# Patient Record
Sex: Female | Born: 1980 | Hispanic: No | Marital: Married | Smoking: Never smoker
Health system: Southern US, Community
[De-identification: ages and names within clinical notes are randomized; demographics above are authoritative.]

## PROBLEM LIST (undated history)

## (undated) DIAGNOSIS — D649 Anemia, unspecified: Secondary | ICD-10-CM

## (undated) DIAGNOSIS — I10 Essential (primary) hypertension: Secondary | ICD-10-CM

## (undated) HISTORY — PX: NO PAST SURGERIES: SHX2092

---

## 2012-12-07 ENCOUNTER — Ambulatory Visit (HOSPITAL_COMMUNITY)
Admission: RE | Admit: 2012-12-07 | Discharge: 2012-12-07 | Disposition: A | Discharge status: Home / Self Care | Payer: No Typology Code available for payment source | Source: Ambulatory Visit | Attending: Internal Medicine | Admitting: Internal Medicine

## 2012-12-07 ENCOUNTER — Other Ambulatory Visit (HOSPITAL_COMMUNITY): Payer: Self-pay | Admitting: Internal Medicine

## 2012-12-07 DIAGNOSIS — M549 Dorsalgia, unspecified: Secondary | ICD-10-CM

## 2013-03-14 ENCOUNTER — Ambulatory Visit: Payer: Self-pay | Admitting: Family

## 2013-03-14 DIAGNOSIS — Z0289 Encounter for other administrative examinations: Secondary | ICD-10-CM

## 2013-05-05 ENCOUNTER — Ambulatory Visit: Payer: BC Managed Care – PPO

## 2013-05-18 ENCOUNTER — Ambulatory Visit: Payer: No Typology Code available for payment source

## 2013-09-22 ENCOUNTER — Encounter (HOSPITAL_COMMUNITY)
Admit: 2013-09-22 | Discharge: 2013-09-22 | Disposition: A | Discharge status: Home / Self Care | Payer: BC Managed Care – PPO | Attending: Obstetrics and Gynecology | Admitting: Obstetrics and Gynecology

## 2013-09-22 ENCOUNTER — Inpatient Hospital Stay (HOSPITAL_COMMUNITY): Payer: BC Managed Care – PPO

## 2013-09-22 ENCOUNTER — Encounter (HOSPITAL_COMMUNITY): Payer: Self-pay

## 2013-09-22 ENCOUNTER — Inpatient Hospital Stay (HOSPITAL_COMMUNITY)
Admission: AD | Admit: 2013-09-22 | Discharge: 2013-09-28 | DRG: 765 | Disposition: A | Discharge status: Home / Self Care | Payer: BC Managed Care – PPO | Source: Ambulatory Visit | Attending: Obstetrics and Gynecology | Admitting: Obstetrics and Gynecology

## 2013-09-22 DIAGNOSIS — D649 Anemia, unspecified: Secondary | ICD-10-CM | POA: Diagnosis present

## 2013-09-22 DIAGNOSIS — O1414 Severe pre-eclampsia complicating childbirth: Secondary | ICD-10-CM | POA: Diagnosis present

## 2013-09-22 DIAGNOSIS — O9903 Anemia complicating the puerperium: Secondary | ICD-10-CM | POA: Diagnosis present

## 2013-09-22 DIAGNOSIS — Z8261 Family history of arthritis: Secondary | ICD-10-CM | POA: Diagnosis not present

## 2013-09-22 DIAGNOSIS — O4400 Placenta previa specified as without hemorrhage, unspecified trimester: Secondary | ICD-10-CM | POA: Diagnosis present

## 2013-09-22 DIAGNOSIS — O9902 Anemia complicating childbirth: Secondary | ICD-10-CM | POA: Diagnosis present

## 2013-09-22 DIAGNOSIS — IMO0002 Reserved for concepts with insufficient information to code with codable children: Secondary | ICD-10-CM | POA: Diagnosis present

## 2013-09-22 DIAGNOSIS — Z8249 Family history of ischemic heart disease and other diseases of the circulatory system: Secondary | ICD-10-CM

## 2013-09-22 DIAGNOSIS — O1490 Unspecified pre-eclampsia, unspecified trimester: Secondary | ICD-10-CM | POA: Diagnosis present

## 2013-09-22 HISTORY — DX: Essential (primary) hypertension: I10

## 2013-09-22 HISTORY — DX: Anemia, unspecified: D64.9

## 2013-09-22 LAB — URINALYSIS, ROUTINE W REFLEX MICROSCOPIC
GLUCOSE, UA: NEGATIVE mg/dL
KETONES UR: NEGATIVE mg/dL
Leukocytes, UA: NEGATIVE
Nitrite: NEGATIVE
Specific Gravity, Urine: 1.02 (ref 1.005–1.030)
Urobilinogen, UA: 0.2 mg/dL (ref 0.0–1.0)
pH: 6.5 (ref 5.0–8.0)

## 2013-09-22 LAB — LACTATE DEHYDROGENASE: LDH: 410 U/L — ABNORMAL HIGH (ref 94–250)

## 2013-09-22 LAB — COMPREHENSIVE METABOLIC PANEL
ALT: 11 U/L (ref 0–35)
AST: 13 U/L (ref 0–37)
Albumin: 1.9 g/dL — ABNORMAL LOW (ref 3.5–5.2)
Alkaline Phosphatase: 101 U/L (ref 39–117)
Anion gap: 11 (ref 5–15)
BUN: 19 mg/dL (ref 6–23)
CALCIUM: 8.9 mg/dL (ref 8.4–10.5)
CO2: 21 meq/L (ref 19–32)
CREATININE: 0.93 mg/dL (ref 0.50–1.10)
Chloride: 105 mEq/L (ref 96–112)
GFR calc Af Amer: >90 mL/min (ref >90)
GFR, EST NON AFRICAN AMERICAN: 80 mL/min — AB (ref >90)
Glucose, Bld: 83 mg/dL (ref 70–99)
Potassium: 5.6 mEq/L — ABNORMAL HIGH (ref 3.7–5.3)
SODIUM: 137 meq/L (ref 137–147)
Total Bilirubin: <0.2 mg/dL — ABNORMAL LOW (ref 0.3–1.2)
Total Protein: 6 g/dL (ref 6.0–8.3)

## 2013-09-22 LAB — URINE MICROSCOPIC-ADD ON

## 2013-09-22 LAB — CBC
HCT: 39.6 % (ref 36.0–46.0)
Hemoglobin: 13.3 g/dL (ref 12.0–15.0)
MCH: 28.4 pg (ref 26.0–34.0)
MCHC: 33.6 g/dL (ref 30.0–36.0)
MCV: 84.4 fL (ref 78.0–100.0)
PLATELETS: 218 10*3/uL (ref 150–400)
RBC: 4.69 MIL/uL (ref 3.87–5.11)
RDW: 14.6 % (ref 11.5–15.5)
WBC: 11.6 10*3/uL — ABNORMAL HIGH (ref 4.0–10.5)

## 2013-09-22 LAB — PROTEIN / CREATININE RATIO, URINE: Creatinine, Urine: 363.67 mg/dL

## 2013-09-22 LAB — URIC ACID: URIC ACID, SERUM: 7.4 mg/dL — AB (ref 2.4–7.0)

## 2013-09-22 MED ORDER — HYDRALAZINE HCL 20 MG/ML IJ SOLN: 5.0000 mg | Freq: Once | INTRAMUSCULAR | Status: DC

## 2013-09-22 MED ORDER — LABETALOL HCL 100 MG PO TABS
200.0000 mg | ORAL_TABLET | Freq: Once | ORAL | Status: AC
  Administered 2013-09-22: 200 mg via ORAL
  Filled 2013-09-22: qty 2

## 2013-09-22 MED ORDER — ZOLPIDEM TARTRATE 5 MG PO TABS
5.0000 mg | ORAL_TABLET | Freq: Every evening | ORAL | Status: DC | PRN
  Administered 2013-09-22: 5 mg via ORAL
  Filled 2013-09-22: qty 1

## 2013-09-22 MED ORDER — BUTALBITAL-APAP-CAFFEINE 50-325-40 MG PO TABS
2.0000 | ORAL_TABLET | Freq: Four times a day (QID) | ORAL | Status: DC | PRN
Start: 2013-09-22 — End: 2013-09-24
  Administered 2013-09-22: 2 via ORAL
  Filled 2013-09-22: qty 2

## 2013-09-22 MED ORDER — DOCUSATE SODIUM 100 MG PO CAPS
100.0000 mg | ORAL_CAPSULE | Freq: Every day | ORAL | Status: DC
Start: 2013-09-22 — End: 2013-09-24
  Administered 2013-09-23: 100 mg via ORAL
  Filled 2013-09-22 (×2): qty 1

## 2013-09-22 MED ORDER — HYDROMORPHONE HCL 2 MG PO TABS
1.0000 mg | ORAL_TABLET | ORAL | Status: DC | PRN
  Filled 2013-09-22: qty 1

## 2013-09-22 MED ORDER — LACTATED RINGERS IV SOLN
INTRAVENOUS | Status: DC
Start: 2013-09-22 — End: 2013-09-23
  Administered 2013-09-22 – 2013-09-23 (×2): via INTRAVENOUS

## 2013-09-22 MED ORDER — LABETALOL HCL 5 MG/ML IV SOLN
10.0000 mg | INTRAVENOUS | Status: DC | PRN
  Administered 2013-09-22: 20 mg via INTRAVENOUS
  Administered 2013-09-22: 10 mg via INTRAVENOUS
  Filled 2013-09-22 (×2): qty 4

## 2013-09-22 MED ORDER — LABETALOL HCL 5 MG/ML IV SOLN
10.0000 mg | Freq: Once | INTRAVENOUS | Status: DC
  Filled 2013-09-22: qty 4

## 2013-09-22 MED ORDER — LABETALOL HCL 5 MG/ML IV SOLN
20.0000 mg | Freq: Once | INTRAVENOUS | Status: AC
  Administered 2013-09-22: 20 mg via INTRAVENOUS
  Filled 2013-09-22: qty 4

## 2013-09-22 MED ORDER — ACETAMINOPHEN 325 MG PO TABS
650.0000 mg | ORAL_TABLET | ORAL | Status: DC | PRN
  Administered 2013-09-22: 650 mg via ORAL
  Filled 2013-09-22: qty 2

## 2013-09-22 MED ORDER — CALCIUM CARBONATE ANTACID 500 MG PO CHEW
2.0000 | CHEWABLE_TABLET | ORAL | Status: DC | PRN
  Administered 2013-09-22 – 2013-09-23 (×3): 400 mg via ORAL
  Filled 2013-09-22 (×4): qty 1

## 2013-09-22 MED ORDER — MAGNESIUM SULFATE BOLUS VIA INFUSION
4.0000 g | Freq: Once | INTRAVENOUS | Status: AC
  Administered 2013-09-22: 4 g via INTRAVENOUS
  Filled 2013-09-22: qty 500

## 2013-09-22 MED ORDER — LABETALOL HCL 100 MG PO TABS: 100.0000 mg | ORAL_TABLET | Freq: Two times a day (BID) | ORAL | Status: DC

## 2013-09-22 MED ORDER — LABETALOL HCL 300 MG PO TABS: 800.0000 mg | ORAL_TABLET | Freq: Three times a day (TID) | ORAL | Status: DC

## 2013-09-22 MED ORDER — BETAMETHASONE SOD PHOS & ACET 6 (3-3) MG/ML IJ SUSP
12.0000 mg | INTRAMUSCULAR | Status: AC
  Administered 2013-09-22 – 2013-09-23 (×2): 12 mg via INTRAMUSCULAR
  Filled 2013-09-22 (×2): qty 2

## 2013-09-22 MED ORDER — PRENATAL MULTIVITAMIN CH
1.0000 | ORAL_TABLET | Freq: Every day | ORAL | Status: DC
  Administered 2013-09-23: 1 via ORAL
  Filled 2013-09-22: qty 1

## 2013-09-22 MED ORDER — LABETALOL HCL 300 MG PO TABS
400.0000 mg | ORAL_TABLET | Freq: Three times a day (TID) | ORAL | Status: DC
  Administered 2013-09-22: 400 mg via ORAL
  Filled 2013-09-22 (×2): qty 1

## 2013-09-22 MED ORDER — LABETALOL HCL 300 MG PO TABS
600.0000 mg | ORAL_TABLET | Freq: Three times a day (TID) | ORAL | Status: DC
  Administered 2013-09-22 – 2013-09-23 (×2): 600 mg via ORAL
  Filled 2013-09-22 (×2): qty 2

## 2013-09-22 MED ORDER — LABETALOL HCL 300 MG PO TABS: 600.0000 mg | ORAL_TABLET | Freq: Three times a day (TID) | ORAL | Status: DC

## 2013-09-22 MED ORDER — MAGNESIUM SULFATE 40 G IN LACTATED RINGERS - SIMPLE
0.5000 g/h | INTRAVENOUS | Status: DC
  Administered 2013-09-24: 0.5 g/h via INTRAVENOUS
  Filled 2013-09-22 (×2): qty 500

## 2013-09-22 MED ORDER — LABETALOL HCL 5 MG/ML IV SOLN
10.0000 mg | Freq: Once | INTRAVENOUS | Status: AC
  Administered 2013-09-22: 10 mg via INTRAVENOUS
  Filled 2013-09-22: qty 4

## 2013-09-22 MED ORDER — HYDROMORPHONE HCL PF 1 MG/ML IJ SOLN
1.0000 mg | INTRAMUSCULAR | Status: DC | PRN
  Administered 2013-09-23 – 2013-09-24 (×7): 1 mg via INTRAVENOUS
  Filled 2013-09-22 (×7): qty 1

## 2013-09-22 NOTE — Progress Notes (Signed)
Care nurse called. Pt c/o the HA was note relieved by tylenol.   Consult with Dr Normand Sloopillard 1mg  Dilaudid PO Q4 PRN for severe pain

## 2013-09-22 NOTE — Progress Notes (Addendum)
Hospital day # 0 pregnancy at 67w5dwith pre-eclampsia with severe fetures.  S:  Pt c/o HA  O: BP 164/96  Pulse 73  Temp(Src) 98.2 F (36.8 C) (Oral)  Resp 20  Ht _0  (1.6 m)  Wt 273 lb 3.2 oz (123.923 kg)  BMI 48.41 kg/m2  SpO2 98%  09/22/13 1726 -- 72 -- -- ! 170/82 mmHg -- -- -- [facility] 1723 98.1 F (36.7 C) 79 -- 20 ! 194/112 mmHg -- -- -- -- [facility]13 1516 -- 75 -- -- 165/96 mmHg -- -- -- -- KW  09/22/13 1516 98.2 F (36.8 C) -- -- 20 -- -- -- -- -- [facility]/13 1501 -- 74 -- -- 150/94 mmHg -- -- -- -- KW  09/22/13 1457 -- 76 -- -- 156/98 mmHg -- -- -- -- KW  09/22/13 1416 -- 68 -- -- 164/92 mmHg -- -- -- -- KW  09/22/13 1401 -- 72 -- -- ! 170/108 mmHg -- -- -- -- KW  09/22/13 1357 -- 70 -- -- ! 178/105 mmHg -- -- -- -- KW  09/22/13 1346 -- 72 -- -- ! 171/102 mmHg -- -- -- -- KW  09/22/13 1331 -- 69 -- -- 165/100 mmHg -- -- -- -- KW  09/22/13 1316 -- 63 -- -- 182/94 mmHg -- -- -- -- KW  09/22/13 1301 -- 61 -- -- ! 177/113 mmHg -- -- -- -- KW  09/22/13 1246 -- 63 -- -- 181/95 mmHg -- -- -- -- KW  09/22/13 1231 -- 62 -- -- 170/99 mmHg -- -- -- -- KW  09/22/13 1216 -- 68 -- -- ! 179/103 mmHg -- -- -- -- KW  09/22/13 1201 -- 69 -- -- ! 188/103 mmHg -- -- -- -- KW  09/22/13 1159 -- 65 -- -- ! 183/107 mmHg -- -- -- -- KW         Labs:   Recent Results (from the past 2160 hour(s))  URINALYSIS, ROUTINE W REFLEX MICROSCOPIC     Status: Abnormal   Collection Time    09/22/13 11:30 AM      Result Value Ref Range   Color, Urine YELLOW  YELLOW   APPearance CLOUDY (*) CLEAR   Specific Gravity, Urine 1.020  1.005 - 1.030   pH 6.5  5.0 - 8.0   Glucose, UA NEGATIVE  NEGATIVE mg/dL   Hgb urine dipstick MODERATE (*) NEGATIVE   Bilirubin Urine SMALL (*) NEGATIVE   Ketones, ur NEGATIVE  NEGATIVE mg/dL   Protein, ur >300 (*) NEGATIVE mg/dL   Urobilinogen, UA 0.2  0.0 - 1.0 mg/dL   Nitrite NEGATIVE  NEGATIVE   Leukocytes, UA NEGATIVE  NEGATIVE  URINE MICROSCOPIC-ADD  ON     Status: Abnormal   Collection Time    09/22/13 11:30 AM      Result Value Ref Range   Squamous Epithelial / LPF MANY (*) RARE   WBC, UA 0-2  <3 WBC/hpf   RBC / HPF 0-2  <3 RBC/hpf   Bacteria, UA MANY (*) RARE  PROTEIN / CREATININE RATIO, URINE     Status: None   Collection Time    09/22/13 11:30 AM      Result Value Ref Range   Creatinine, Urine 363.67     Total Protein, Urine >5000     Comment: NO NORMAL RANGE ESTABLISHED FOR THIS TEST     RESULTS CONFIRMED BY MANUAL DILUTION   PROTEIN CREATININE RATIO  0.00 - 0.15   Comment: RESULT BELOW REPORTABLE RANGE,     UNABLE TO CALCULATE.  COMPREHENSIVE METABOLIC PANEL     Status: Abnormal   Collection Time    09/22/13  1:10 PM      Result Value Ref Range   Sodium 137  137 - 147 mEq/L   Potassium 5.6 (*) 3.7 - 5.3 mEq/L   Chloride 105  96 - 112 mEq/L   CO2 21  19 - 32 mEq/L   Glucose, Bld 83  70 - 99 mg/dL   BUN 19  6 - 23 mg/dL   Creatinine, Ser 0.93  0.50 - 1.10 mg/dL   Calcium 8.9  8.4 - 10.5 mg/dL   Total Protein 6.0  6.0 - 8.3 g/dL   Albumin 1.9 (*) 3.5 - 5.2 g/dL   AST 13  0 - 37 U/L   ALT 11  0 - 35 U/L   Alkaline Phosphatase 101  39 - 117 U/L   Total Bilirubin <0.2 (*) 0.3 - 1.2 mg/dL   GFR calc non Af Amer 80 (*) >90 mL/min   GFR calc Af Amer >90  >90 mL/min   Comment: (NOTE)     The eGFR has been calculated using the CKD EPI equation.     This calculation has not been validated in all clinical situations.     eGFR's persistently <90 mL/min signify possible Chronic Kidney     Disease.   Anion gap 11  5 - 15  CBC     Status: Abnormal   Collection Time    09/22/13  1:10 PM      Result Value Ref Range   WBC 11.6 (*) 4.0 - 10.5 K/uL   RBC 4.69  3.87 - 5.11 MIL/uL   Hemoglobin 13.3  12.0 - 15.0 g/dL   HCT 39.6  36.0 - 46.0 %   MCV 84.4  78.0 - 100.0 fL   MCH 28.4  26.0 - 34.0 pg   MCHC 33.6  30.0 - 36.0 g/dL   RDW 14.6  11.5 - 15.5 %   Platelets 218  150 - 400 K/uL  URIC ACID     Status: Abnormal    Collection Time    09/22/13  1:10 PM      Result Value Ref Range   Uric Acid, Serum 7.4 (*) 2.4 - 7.0 mg/dL  LACTATE DEHYDROGENASE     Status: Abnormal   Collection Time    09/22/13  1:10 PM      Result Value Ref Range   LDH 410 (*) 94 - 250 U/L     A: 84w5dwith severe pre eclampsia     unchanged  P: Continue current plan of care      Consult with Dr DCharlesetta Garibaldi     Per MFM recommendation start Magnesium 4g bolus with a 2gm maintenance per hour       1075mlabetalol PO BID.  Hold if systolic <1<256r diastolic <8<38    Tylenol 65024mor HA      24 urine collection      MDs will follow    Jazlene Bares, CNM, MSN 09/22/2013. 9:41 PM

## 2013-09-22 NOTE — MAU Note (Signed)
Patient states she has has swelling that is getting worse in her face, hands, arms, feet and legs. Has headaches off and on for the past 4 days. Denies contractions, bleeding, leaking and reports good fetal movement.

## 2013-09-22 NOTE — MAU Provider Note (Signed)
Amanda Ryan is a 33 y.o. G3P3003 at 26.5 weeks. Presents to MAU c/o of HA, swelling of hands, legs, face.  She denies URQ pain, vision changes or n/v.     History     Patient Active Problem List   Diagnosis Date Noted  . Preeclampsia 09/22/2013    Chief Complaint  Patient presents with  . Facial Swelling  . Leg Swelling   HPI  OB History   Grav Para Term Preterm Abortions TAB SAB Ect Mult Living   4 3 3       3       Past Medical History  Diagnosis Date  . Hypertension   . Anemia     Past Surgical History  Procedure Laterality Date  . No past surgeries      Family History  Problem Relation Age of Onset  . Hypertension Mother   . Hyperlipidemia Mother   . Hypertension Father   . Hypertension Maternal Grandmother   . Depression Maternal Grandmother   . Hypertension Maternal Grandfather   . Depression Maternal Grandfather   . Arthritis Paternal Grandmother   . Hypertension Paternal Grandmother   . Depression Paternal Grandmother   . Arthritis Paternal Grandfather   . Hypertension Paternal Grandfather   . Depression Paternal Grandfather     History  Substance Use Topics  . Smoking status: Never Smoker   . Smokeless tobacco: Not on file  . Alcohol Use: No    Allergies:  Allergies  Allergen Reactions  . Minocycline Hives and Other (See Comments)    Throat swelling  . Sulfa Antibiotics Hives and Other (See Comments)    Eyes turn red, and can't see     Prescriptions prior to admission  Medication Sig Dispense Refill  . famotidine (PEPCID) 10 MG tablet Take 10 mg by mouth 2 (two) times daily.      Marland Kitchen. PRESCRIPTION MEDICATION Apply 1 application topically daily. Medication contains-Amandadine 3%, Carbamazepine 3%, Gabapentin 6%, Meloxicam 0.5% and Pentoxifylline 3%        Review of Systems  Eyes: Negative for blurred vision and double vision.  Cardiovascular: Positive for leg swelling.  Gastrointestinal: Negative for nausea, vomiting and  abdominal pain.  Neurological: Positive for headaches.   See HPI above, all other systems are negative  Physical Exam   Blood pressure 158/83, pulse 72, temperature 98.2 F (36.8 C), temperature source Oral, resp. rate 20, height 5\' 3"  (1.6 m), weight 273 lb 3.2 oz (123.923 kg), SpO2 98.00%. BP's 171//103, 183/107, 188/103, 179/103  Physical Exam  Constitutional: She is oriented to person, place, and time. She appears well-developed and well-nourished.  Neck: Normal range of motion.  Cardiovascular: Normal rate.   Respiratory: Effort normal and breath sounds normal.  GI: Soft. She exhibits no distension. There is no tenderness. There is no rebound and no guarding.  Musculoskeletal: Normal range of motion.  Neurological: She is alert and oriented to person, place, and time. She has normal reflexes.  Skin: Skin is warm and dry.  Psychiatric: She has a normal mood and affect. Her behavior is normal.  ABD: Soft, non tender to palpation, no rebound or guarding SVE: not done   ED Course  Assessment: IUP at  26.5 weeks Membranes: intact FHR: Category 1 CTX:  none   Plan: Consult with Dr. Normand Sloopillard Labs: LDH, CBC, CMET, Uric acid, PC 10mg  Labetalol IV per hypertensive protocol Tylenol 650mg  for HA Con't monitoring    Kort Stettler, CNM, MSN 09/22/2013. 8:51 PM

## 2013-09-22 NOTE — Consult Note (Signed)
Asked by Dr Normand Sloopillard to speak to Mrs Amanda Ryan to discuss outcome of preterm pregnancy at 26-27 weeks. Chart reviewed. She is 26 5/7 weeks, with severe preeclampsia. She has intact membranes, without contractions. She has received her first dose of betamethasone today and was started on magnesium sulfate. She is also on labetalol and  hydromorphone.   I spoke to Mrs Amanda Ryan and her husband in her room. I discussed survival rate and common morbidities associated with this gestation such as RDS, GI immaturity, and high risk for  IVH. Discussed need for temp support, HAL, umbilical lines, gavage feeding, and LOS. I discussed resuscitation and presence of NICU Team at delivery.   I also discussed breast feeding and benefits especially to a preterm baby.   I answered their questions to her satisfaction.   I spent 30 minutes with this consult, more than 50% of the time was with face-to-face counseling with Mr and Mrs Amanda Ryan. Thank you for inviting us to meet with them to prepare them for possible preterm delivery.   Amanda Garfinkelita Q Jaizon Deroos, MD  Neonatologist

## 2013-09-22 NOTE — Progress Notes (Signed)
Pt c/o aching in back of neck down into shoulders- immediately relieved by massage.  Pt is very anxious- encouraged relaxation, plan of care explained in detail.  Pt has h/o migraine h/a; denies nausea, visual disturbances, or epigastric pain.

## 2013-09-22 NOTE — Progress Notes (Signed)
Patient ID: Bing QuarryESMERALDA Cermak, female   DOB: February 27, 1981, 33 y.o.   MRN: [id number]  Hospital day # 0 pregnancy at 689w5d   S: HA was resolving, now returning, good fetal activity      Contractions:none      Vaginal bleeding:none now       Vaginal discharge: no significant change  Denies N/V/RUQ pain C/o intermittent SOB, (similar to what has experienced the last few days) Denies chest pain   O: BP 164/96  Pulse 73  Temp(Src) 98.2 F (36.8 C) (Oral)  Resp 20  Ht 5\' 3"  (1.6 m)  Wt 273 lb 3.2 oz (123.923 kg)  BMI 48.41 kg/m2  SpO2 98%  Filed Vitals:   09/22/13 1901 09/22/13 2020 09/22/13 2100 09/22/13 2115  BP: 168/91 158/83 167/102 164/96  Pulse: 73     Temp:      TempSrc:      Resp:      Height:      Weight:      SpO2:             Fetal tracings:reviewed and reassuring      Uterus non-tender      Extremities: edema 2+ and Homans sign is negative, no sign of DVT  A: 119w5d with severe pre-eclampsia      Just rcv'd 20mg  IV labetalol rcv'd Mag sulfate 4g bolus at 1745, on 2g/hour   P: will give fiorcet for headache CTO closely C/w Dr Pura Spiceivard     Seabron Iannello M  09/22/2013 9:54 PM

## 2013-09-22 NOTE — Consult Note (Signed)
Maternal Fetal Medicine Consultation  Requesting Provider(s): Crawford Givens, MD  Reason for consultation: Preeclampsia with severe features  HPI: Amanda Ryan is a 33 year-old G4P3003 currently at 22w 5d who is currently admitted due to severe range blood pressures and preeclampsia with severe features.  The patient reports that she has had worsening face, hand and lower extremity edema over the last several days / weeks.  She was seen earlier today in the MAU and noted to have severe range blood pressures.  Since that time, she has required 3 doses of IV Labetalol to control blood pressures.  She also reports a 5/10 frontal headache but denies visual changes or RUQ pain.  The fetus is active.  The patient's past OB history is remarkable for three prior uncomplicated, term vaginal deliveries (same father of the baby).  She reports a possible history of Chronic hypertension - was previously on po Propanolol but did not require it routinely.  Her baseline blood pressures were 130/70 and 120/80 at 17 and 18 weeks respectively.  OB History: OB History   Grav Para Term Preterm Abortions TAB SAB Ect Mult Living   _0 PMH:  Past Medical History  Diagnosis Date  . Hypertension   . Anemia     PSH:  Past Surgical History  Procedure Laterality Date  . No past surgeries     Meds:  Current Facility-Administered Medications on File Prior to Encounter  Medication Dose Route Frequency Provider Last Rate Last Dose  . acetaminophen (TYLENOL) tablet 650 mg  650 mg Oral Q4H PRN Venus Standard, CNM   650 mg at 09/22/13 1640  . betamethasone acetate-betamethasone sodium phosphate (CELESTONE) injection 12 mg  12 mg Intramuscular Q24H Venus Standard, CNM   12 mg at 09/22/13 1650  . calcium carbonate (TUMS - dosed in mg elemental calcium) chewable tablet 400 mg of elemental calcium  2 tablet Oral Q4H PRN Venus Standard, CNM      . docusate sodium (COLACE) capsule 100 mg  100 mg Oral  Daily Venus Standard, CNM      . labetalol (NORMODYNE) tablet 400 mg  400 mg Oral 3 times per day Naima A Dillard, MD      . lactated ringers infusion   Intravenous Continuous Venus Standard, CNM      . magnesium bolus via infusion 4 g  4 g Intravenous Once Naima A Dillard, MD      . magnesium sulfate 40 grams in LR 500 mL OB infusion  2 g/hr Intravenous Titrated Naima A Dillard, MD      . Derrill Memo ON 09/23/2013] prenatal multivitamin tablet 1 tablet  1 tablet Oral Q1200 Venus Standard, CNM      . zolpidem (AMBIEN) tablet 5 mg  5 mg Oral QHS PRN Venus Standard, CNM       Current Outpatient Prescriptions on File Prior to Encounter  Medication Sig Dispense Refill  . famotidine (PEPCID) 10 MG tablet Take 10 mg by mouth 2 (two) times daily.      Marland Kitchen PRESCRIPTION MEDICATION Apply 1 application topically daily. Medication contains-Amandadine 3%, Carbamazepine 3%, Gabapentin 6%, Meloxicam 0.5% and Pentoxifylline 3%       Allergies:  Allergies  Allergen Reactions  . Minocycline Hives and Other (See Comments)    Throat swelling  . Sulfa Antibiotics Hives and Other (See Comments)    Eyes turn red, and can't see    FH:  Family  History  Problem Relation Age of Onset  . Hypertension Mother   . Hyperlipidemia Mother   . Hypertension Father   . Hypertension Maternal Grandmother   . Depression Maternal Grandmother   . Hypertension Maternal Grandfather   . Depression Maternal Grandfather   . Arthritis Paternal Grandmother   . Hypertension Paternal Grandmother   . Depression Paternal Grandmother   . Arthritis Paternal Grandfather   . Hypertension Paternal Grandfather   . Depression Paternal Grandfather    Soc:  History   Social History  . Marital Status: Married    Spouse Name: N/A    Number of Children: N/A  . Years of Education: N/A   Occupational History  . Not on file.   Social History Main Topics  . Smoking status: Never Smoker   . Smokeless tobacco: Not on file  . Alcohol Use: No   . Drug Use: No  . Sexual Activity: Yes   Other Topics Concern  . Not on file   Social History Narrative  . No narrative on file    Review of Systems: no vaginal bleeding or cramping/contractions, no LOF, no nausea/vomiting. All other systems reviewed and are negative.  PE: BPs: 165/96, 150/94, 164/92, 178/108, 178/105    GEN: well-appearing female ABD: gravid, NT Ext: 2+ pretibial edema, 2+ DTRs with single beat of clonus  Ultrasound: Single IUP at 26w 5d Preeclampsia with severe features The estimated fetal weight today is at the 27th %tile; the [facility] measures at the 18th tile Limited views of the fetal heart obtained due to fetal position and maternal body habitus UA Doppler studies elevated for gestational age (97th %tile) but without AEDF or REDF Normal amniotic fluid volume A posterior, low lying placenta is noted  Labs:  Recent Results (from the past 2160 hour(s))  URINALYSIS, ROUTINE W REFLEX MICROSCOPIC     Status: Abnormal   Collection Time    09/22/13 11:30 AM      Result Value Ref Range   Color, Urine YELLOW  YELLOW   APPearance CLOUDY (*) CLEAR   Specific Gravity, Urine 1.020  1.005 - 1.030   pH 6.5  5.0 - 8.0   Glucose, UA NEGATIVE  NEGATIVE mg/dL   Hgb urine dipstick MODERATE (*) NEGATIVE   Bilirubin Urine SMALL (*) NEGATIVE   Ketones, ur NEGATIVE  NEGATIVE mg/dL   Protein, ur >300 (*) NEGATIVE mg/dL   Urobilinogen, UA 0.2  0.0 - 1.0 mg/dL   Nitrite NEGATIVE  NEGATIVE   Leukocytes, UA NEGATIVE  NEGATIVE  URINE MICROSCOPIC-ADD ON     Status: Abnormal   Collection Time    09/22/13 11:30 AM      Result Value Ref Range   Squamous Epithelial / LPF MANY (*) RARE   WBC, UA 0-2  <3 WBC/hpf   RBC / HPF 0-2  <3 RBC/hpf   Bacteria, UA MANY (*) RARE  PROTEIN / CREATININE RATIO, URINE     Status: None   Collection Time    09/22/13 11:30 AM      Result Value Ref Range   Creatinine, Urine 363.67     Total Protein, Urine >5000     Comment: NO NORMAL RANGE  ESTABLISHED FOR THIS TEST     RESULTS CONFIRMED BY MANUAL DILUTION   PROTEIN CREATININE RATIO         0.00 - 0.15   Comment: RESULT BELOW REPORTABLE RANGE,     UNABLE TO CALCULATE.  COMPREHENSIVE METABOLIC PANEL     Status:  Abnormal   Collection Time    09/22/13  1:10 PM      Result Value Ref Range   Sodium 137  137 - 147 mEq/L   Potassium 5.6 (*) 3.7 - 5.3 mEq/L   Chloride 105  96 - 112 mEq/L   CO2 21  19 - 32 mEq/L   Glucose, Bld 83  70 - 99 mg/dL   BUN 19  6 - 23 mg/dL   Creatinine, Ser 0.93  0.50 - 1.10 mg/dL   Calcium 8.9  8.4 - 10.5 mg/dL   Total Protein 6.0  6.0 - 8.3 g/dL   Albumin 1.9 (*) 3.5 - 5.2 g/dL   AST 13  0 - 37 U/L   ALT 11  0 - 35 U/L   Alkaline Phosphatase 101  39 - 117 U/L   Total Bilirubin <0.2 (*) 0.3 - 1.2 mg/dL   GFR calc non Af Amer 80 (*) >90 mL/min   GFR calc Af Amer >90  >90 mL/min   Comment: (NOTE)     The eGFR has been calculated using the CKD EPI equation.     This calculation has not been validated in all clinical situations.     eGFR's persistently <90 mL/min signify possible Chronic Kidney     Disease.   Anion gap 11  5 - 15  CBC     Status: Abnormal   Collection Time    09/22/13  1:10 PM      Result Value Ref Range   WBC 11.6 (*) 4.0 - 10.5 K/uL   RBC 4.69  3.87 - 5.11 MIL/uL   Hemoglobin 13.3  12.0 - 15.0 g/dL   HCT 39.6  36.0 - 46.0 %   MCV 84.4  78.0 - 100.0 fL   MCH 28.4  26.0 - 34.0 pg   MCHC 33.6  30.0 - 36.0 g/dL   RDW 14.6  11.5 - 15.5 %   Platelets 218  150 - 400 K/uL  URIC ACID     Status: Abnormal   Collection Time    09/22/13  1:10 PM      Result Value Ref Range   Uric Acid, Serum 7.4 (*) 2.4 - 7.0 mg/dL  LACTATE DEHYDROGENASE     Status: Abnormal   Collection Time    09/22/13  1:10 PM      Result Value Ref Range   LDH 410 (*) 94 - 250 U/L    A/P: 1) Single IUP at [redacted]w[redacted]d        2) Preeclampsia with severe features - based on multiple, severe range blood pressures and proteinuria.    Recommendations: 1) Please  start Magnesium sulfate as soon as possible - would continue pending completion of Betamethasone for fetal lung maturity.  If the patient's clinical status has improved over the next 48 hrs, would consider discontinuing the magnesium at that point. 2) Concur this Betamethasone - if at all possible, would try to postpone delivery until steroid complete (48 hrs) - may consider abbreviated course of betamethasone (12 mg q 12 hours vs. 24 hours), particularly if unable to control blood pressures or if headaches do not improve. 3) NICU consult 4) Recommend increasing Labetalol dose to 300 or 400 mg q 8 hours.  May titrate upward if needed to a maximum dose of 800 mg q 8 hours.   5) Fetal strips as least every shift 6) Would move toward delivery for severe headaches unrelieved with medications, severe range blood pressures despite  antihypertensive medications, HELLP syndrome, pulmonary edema or Creatinine > 1.2, non-reassuring fetal status.  If at all possible, would try to delay delivery until steroid complete - would reasses clinical situation at that point to determine whether continued inpatient observation vs. Delivery is warranted. 7) Would check preeclampsia labs daily for next 48 hrs - if stable, decrease frequency to every 48-72 hours.  Recommendations discussed with Dr. Charlesetta Garibaldi.   Thank you for the opportunity to be a part of the care of [place]. Please contact our office if we can be of further assistance.   I spent approximately 30 minutes with this patient with over 50% of time spent in face-to-face counseling.  Benjaman Lobe, MD Maternal Fetal Medicine

## 2013-09-22 NOTE — H&P (Signed)
Amanda Ryan is a 33 y.o. female, G4P3003 at 26.5 weeks  Patient Active Problem List   Diagnosis Date Noted  . Preeclampsia 09/22/2013    Pregnancy Course: Patient entered care at 16.4 weeks.   EDC of 12/24/13 was established by LMP.   Anatomy scan:  18.4 weeks, with normal findings and an posterior placenta.   Additional US evaluations:  6/17 FU anatomy.   Significant prenatal events:  Previa as of 6/17 US    Last evaluation:  25.4 weeks   VE:n/a  Reason for admission: pre eclampsia    OB History   Grav Para Term Preterm Abortions TAB SAB Ect Mult Living   4 3 3       3      Past Medical History  Diagnosis Date  . Hypertension   . Anemia    Past Surgical History  Procedure Laterality Date  . No past surgeries     Family History: family history includes Arthritis in her paternal grandfather and paternal grandmother; Depression in her maternal grandfather, maternal grandmother, paternal grandfather, and paternal grandmother; Hyperlipidemia in her mother; Hypertension in her father, maternal grandfather, maternal grandmother, mother, paternal grandfather, and paternal grandmother. Social History:  reports that she has never smoked. She does not have any smokeless tobacco history on file. She reports that she does not drink alcohol or use illicit drugs.   Prenatal Transfer Tool  Maternal Diabetes: No Genetic Screening: Normal Maternal Ultrasounds/Referrals: Normal Fetal Ultrasounds or other Referrals:  None Maternal Substance Abuse:  No Significant Maternal Medications:  None Significant Maternal Lab Results: None   ROS:  See HPI above, all other systems are negative  Allergies  Allergen Reactions  . Minocycline Hives and Other (See Comments)    Throat swelling  . Sulfa Antibiotics Hives and Other (See Comments)    Eyes turn red, and can't see        Blood pressure 165/96, pulse 75, temperature 97.8 F (36.6 C), temperature source Oral, resp. rate 12,  height 5\' 3"  (1.6 m), weight 273 lb 3.2 oz (123.923 kg), SpO2 98.00%.  Marland Kitchen.v 09/22/13 1246 -- 63 -- -- 181/95 mmHg -- -- -- -- KW  09/22/13 1231 -- 62 -- -- 170/99 mmHg -- -- -- -- KW  09/22/13 1216 -- 68 -- -- ! 179/103 mmHg -- -- -- -- KW  09/22/13 1201 -- 69 -- -- ! 188/103 mmHg -- -- -- -- KW  09/22/13 1159 -- 65 -- -- ! 183/107 mmHg -- -- -- -- KW    Fetal Exam:  Fetal Monitor Surveillance : Continuous Monitoring  Mode: Ultrasound.  FHR: reactive   Physical Exam: Nursing note and vitals reviewed.  She appears well nourished.  Psychiatric: Normal mood and affect. Her behavior is normal.  Head: Normocephalic.  Eyes: Pupils are equal, round, and reactive to light.  Neck: Normal range of motion.  Cardiovascular: RRR without murmur.  Respiratory: Clear bilaterally. Effort normal.  GI: Soft. Bowel sounds are normal.  Genitourinary: Vagina normal.  Musculoskeletal: Normal range of motion.  Ext: WNL, Homan's sign negative bilaterally Neurological: A&Ox3. Normal reflexes.  Skin: Warm and dry.    Prenatal labs: ABO, Rh:  O pos Antibody:  neg Rubella:   Non immune RPR:   NR HBsAg:   neg HIV:   neg GBS:  unknown Sickle cell/Hgb electrophoresis:  WNL Pap:  5/15 wnl GC:  neg Chlamydia:  neg Genetic screenings:  wnl Glucola:  wnl   Assessment IUP at 25.6 Weeks Membrane:  intact GBS unknown    Plan: Consult with Dr Normand Sloop Admit to AP service MFM consult NICU consult [place] Continuous monitoring BMZ series   IV pain medication per orders Epidural per patient request Foley cath after patient is comfortable with epidural GBS prophylaxis   Esha Fincher, CNM, MSN 09/22/2013, 3:47 PM

## 2013-09-23 LAB — COMPREHENSIVE METABOLIC PANEL
ALK PHOS: 110 U/L (ref 39–117)
ALK PHOS: 101 U/L (ref 39–117)
ALT: 11 U/L (ref 0–35)
ALT: 11 U/L (ref 0–35)
ANION GAP: 11 (ref 5–15)
ANION GAP: 11 (ref 5–15)
AST: 11 U/L (ref 0–37)
AST: 10 U/L (ref 0–37)
Albumin: 2 g/dL — ABNORMAL LOW (ref 3.5–5.2)
Albumin: 2 g/dL — ABNORMAL LOW (ref 3.5–5.2)
BUN: 26 mg/dL — ABNORMAL HIGH (ref 6–23)
BUN: 28 mg/dL — AB (ref 6–23)
CO2: 20 mEq/L (ref 19–32)
CO2: 22 mEq/L (ref 19–32)
Calcium: 8.7 mg/dL (ref 8.4–10.5)
Calcium: 8.8 mg/dL (ref 8.4–10.5)
Chloride: 102 mEq/L (ref 96–112)
Chloride: 99 mEq/L (ref 96–112)
Creatinine, Ser: 0.91 mg/dL (ref 0.50–1.10)
Creatinine, Ser: 0.95 mg/dL (ref 0.50–1.10)
GFR calc Af Amer: >90 mL/min (ref >90)
GFR calc non Af Amer: 82 mL/min — ABNORMAL LOW (ref >90)
GFR calc non Af Amer: 78 mL/min — ABNORMAL LOW (ref >90)
GLUCOSE: 142 mg/dL — AB (ref 70–99)
Glucose, Bld: 131 mg/dL — ABNORMAL HIGH (ref 70–99)
POTASSIUM: 6.7 meq/L — AB (ref 3.7–5.3)
POTASSIUM: 5.2 meq/L (ref 3.7–5.3)
Sodium: 133 mEq/L — ABNORMAL LOW (ref 137–147)
Sodium: 132 mEq/L — ABNORMAL LOW (ref 137–147)
Total Protein: 6.5 g/dL (ref 6.0–8.3)
Total Protein: 6.1 g/dL (ref 6.0–8.3)

## 2013-09-23 LAB — CBC
HEMATOCRIT: 43.2 % (ref 36.0–46.0)
HEMATOCRIT: 40.3 % (ref 36.0–46.0)
HEMOGLOBIN: 14.6 g/dL (ref 12.0–15.0)
HEMOGLOBIN: 13.4 g/dL (ref 12.0–15.0)
MCH: 28.3 pg (ref 26.0–34.0)
MCH: 28.2 pg (ref 26.0–34.0)
MCHC: 33.8 g/dL (ref 30.0–36.0)
MCHC: 33.3 g/dL (ref 30.0–36.0)
MCV: 83.7 fL (ref 78.0–100.0)
MCV: 84.7 fL (ref 78.0–100.0)
Platelets: 243 10*3/uL (ref 150–400)
Platelets: 263 10*3/uL (ref 150–400)
RBC: 5.16 MIL/uL — ABNORMAL HIGH (ref 3.87–5.11)
RBC: 4.76 MIL/uL (ref 3.87–5.11)
RDW: 14.7 % (ref 11.5–15.5)
RDW: 15.1 % (ref 11.5–15.5)
WBC: 14.9 10*3/uL — AB (ref 4.0–10.5)
WBC: 17.2 10*3/uL — ABNORMAL HIGH (ref 4.0–10.5)

## 2013-09-23 LAB — MAGNESIUM
Magnesium: 6.6 mg/dL (ref 1.5–2.5)
Magnesium: 6.4 mg/dL (ref 1.5–2.5)
Magnesium: 6 mg/dL — ABNORMAL HIGH (ref 1.5–2.5)

## 2013-09-23 LAB — URIC ACID
URIC ACID, SERUM: 9 mg/dL — AB (ref 2.4–7.0)
Uric Acid, Serum: 8.3 mg/dL — ABNORMAL HIGH (ref 2.4–7.0)

## 2013-09-23 LAB — LACTATE DEHYDROGENASE
LDH: 328 U/L — ABNORMAL HIGH (ref 94–250)
LDH: 282 U/L — ABNORMAL HIGH (ref 94–250)

## 2013-09-23 MED ORDER — LABETALOL HCL 300 MG PO TABS
600.0000 mg | ORAL_TABLET | Freq: Three times a day (TID) | ORAL | Status: DC
  Administered 2013-09-23 – 2013-09-24 (×3): 600 mg via ORAL
  Filled 2013-09-23 (×6): qty 2

## 2013-09-23 MED ORDER — LACTATED RINGERS IV SOLN
INTRAVENOUS | Status: DC
  Administered 2013-09-24: 06:00:00 via INTRAVENOUS

## 2013-09-23 MED ORDER — LABETALOL HCL 5 MG/ML IV SOLN: 10.0000 mg | INTRAVENOUS | Status: DC | PRN

## 2013-09-23 MED ORDER — ONDANSETRON HCL 4 MG/2ML IJ SOLN
4.0000 mg | Freq: Four times a day (QID) | INTRAMUSCULAR | Status: DC | PRN
  Administered 2013-09-23 (×2): 4 mg via INTRAVENOUS
  Filled 2013-09-23 (×2): qty 2

## 2013-09-23 MED ORDER — PROMETHAZINE HCL 25 MG/ML IJ SOLN
25.0000 mg | Freq: Four times a day (QID) | INTRAMUSCULAR | Status: DC | PRN
  Administered 2013-09-23: 25 mg via INTRAVENOUS
  Filled 2013-09-23: qty 1

## 2013-09-23 MED ORDER — LABETALOL HCL 300 MG PO TABS: 600.0000 mg | ORAL_TABLET | Freq: Two times a day (BID) | ORAL | Status: DC

## 2013-09-23 NOTE — Progress Notes (Signed)
CRITICAL VALUE ALERT  Critical value received:  6.7 Potassium Date of notification: 09/23/13 Time of notification: 0650 Critical value read back:Yes.    Nurse who received alert:  Purvis KiltsAshley Davis, RN  MD notified (1st page):  [phone] of first page:  Phone MD notified (2nd page):  Time of second page:  Responding MD: Gevena BarreS. Lillard, CNM Time MD [phone]:[phone]

## 2013-09-23 NOTE — Progress Notes (Addendum)
Hospital day # 1 pregnancy at [redacted]w[redacted]d-with severe pre eclampsia  S:  Pt reports her HA is better while lying down  O: BP 148/87  Pulse 64  Temp(Src) 98.4 F (36.9 C) (Oral)  Resp 20  Ht _0  (1.6 m)  Wt 277 lb 11.2 oz (125.964 kg)  BMI 49.20 kg/m2  SpO2 98%  Filed Vitals:   09/23/13 1420 09/23/13 1423 09/23/13 1431 09/23/13 1548  BP: 164/88 152/95 148/87 151/81  Pulse: 61 64 64 66  Temp:      TempSrc:      Resp:      Height:      Weight:      SpO2:                 Extremities: edema +2           Decreased urine out put           DTR+1           Lungs clear bilaterally                                 Recent Results (from the past 2160 hour(s))  URINALYSIS, ROUTINE W REFLEX MICROSCOPIC     Status: Abnormal   Collection Time    09/22/13 11:30 AM      Result Value Ref Range   Color, Urine YELLOW  YELLOW   APPearance CLOUDY (*) CLEAR   Specific Gravity, Urine 1.020  1.005 - 1.030   pH 6.5  5.0 - 8.0   Glucose, UA NEGATIVE  NEGATIVE mg/dL   Hgb urine dipstick MODERATE (*) NEGATIVE   Bilirubin Urine SMALL (*) NEGATIVE   Ketones, ur NEGATIVE  NEGATIVE mg/dL   Protein, ur >300 (*) NEGATIVE mg/dL   Urobilinogen, UA 0.2  0.0 - 1.0 mg/dL   Nitrite NEGATIVE  NEGATIVE   Leukocytes, UA NEGATIVE  NEGATIVE  URINE MICROSCOPIC-ADD ON     Status: Abnormal   Collection Time    09/22/13 11:30 AM      Result Value Ref Range   Squamous Epithelial / LPF MANY (*) RARE   WBC, UA 0-2  <3 WBC/hpf   RBC / HPF 0-2  <3 RBC/hpf   Bacteria, UA MANY (*) RARE  PROTEIN / CREATININE RATIO, URINE     Status: None   Collection Time    09/22/13 11:30 AM      Result Value Ref Range   Creatinine, Urine 363.67     Total Protein, Urine >5000     Comment: NO NORMAL RANGE ESTABLISHED FOR THIS TEST     RESULTS CONFIRMED BY MANUAL DILUTION   PROTEIN CREATININE RATIO         0.00 - 0.15   Comment: RESULT BELOW REPORTABLE RANGE,     UNABLE TO CALCULATE.  COMPREHENSIVE METABOLIC PANEL     Status:  Abnormal   Collection Time    09/22/13  1:10 PM      Result Value Ref Range   Sodium 137  137 - 147 mEq/L   Potassium 5.6 (*) 3.7 - 5.3 mEq/L   Chloride 105  96 - 112 mEq/L   CO2 21  19 - 32 mEq/L   Glucose, Bld 83  70 - 99 mg/dL   BUN 19  6 - 23 mg/dL   Creatinine, Ser 0.93  0.50 - 1.10 mg/dL   Calcium 8.9  8.4 - 10.5 mg/dL  Total Protein 6.0  6.0 - 8.3 g/dL   Albumin 1.9 (*) 3.5 - 5.2 g/dL   AST 13  0 - 37 U/L   ALT 11  0 - 35 U/L   Alkaline Phosphatase 101  39 - 117 U/L   Total Bilirubin <0.2 (*) 0.3 - 1.2 mg/dL   GFR calc non Af Amer 80 (*) >90 mL/min   GFR calc Af Amer >90  >90 mL/min   Comment: (NOTE)     The eGFR has been calculated using the CKD EPI equation.     This calculation has not been validated in all clinical situations.     eGFR's persistently <90 mL/min signify possible Chronic Kidney     Disease.   Anion gap 11  5 - 15  CBC     Status: Abnormal   Collection Time    09/22/13  1:10 PM      Result Value Ref Range   WBC 11.6 (*) 4.0 - 10.5 K/uL   RBC 4.69  3.87 - 5.11 MIL/uL   Hemoglobin 13.3  12.0 - 15.0 g/dL   HCT 39.6  36.0 - 46.0 %   MCV 84.4  78.0 - 100.0 fL   MCH 28.4  26.0 - 34.0 pg   MCHC 33.6  30.0 - 36.0 g/dL   RDW 14.6  11.5 - 15.5 %   Platelets 218  150 - 400 K/uL  URIC ACID     Status: Abnormal   Collection Time    09/22/13  1:10 PM      Result Value Ref Range   Uric Acid, Serum 7.4 (*) 2.4 - 7.0 mg/dL  LACTATE DEHYDROGENASE     Status: Abnormal   Collection Time    09/22/13  1:10 PM      Result Value Ref Range   LDH 410 (*) 94 - 250 U/L  CBC     Status: Abnormal   Collection Time    09/23/13  5:43 AM      Result Value Ref Range   WBC 14.9 (*) 4.0 - 10.5 K/uL   RBC 5.16 (*) 3.87 - 5.11 MIL/uL   Hemoglobin 14.6  12.0 - 15.0 g/dL   HCT 43.2  36.0 - 46.0 %   MCV 83.7  78.0 - 100.0 fL   MCH 28.3  26.0 - 34.0 pg   MCHC 33.8  30.0 - 36.0 g/dL   RDW 14.7  11.5 - 15.5 %   Platelets 243  150 - 400 K/uL  COMPREHENSIVE METABOLIC PANEL      Status: Abnormal   Collection Time    09/23/13  5:43 AM      Result Value Ref Range   Sodium 133 (*) 137 - 147 mEq/L   Potassium 6.7 (*) 3.7 - 5.3 mEq/L   Comment: NO VISIBLE HEMOLYSIS     REPEATED TO VERIFY     CRITICAL RESULT CALLED TO, READ BACK BY AND VERIFIED WITH:     DAVIS,A. _0  ON 09/23/2013 BY BOVELL,A.   Chloride 102  96 - 112 mEq/L   CO2 20  19 - 32 mEq/L   Glucose, Bld 142 (*) 70 - 99 mg/dL   BUN 26 (*) 6 - 23 mg/dL   Creatinine, Ser 0.91  0.50 - 1.10 mg/dL   Calcium 8.7  8.4 - 10.5 mg/dL   Total Protein 6.5  6.0 - 8.3 g/dL   Albumin 2.0 (*) 3.5 - 5.2 g/dL   AST 11  0 - 37 U/L  ALT 11  0 - 35 U/L   Alkaline Phosphatase 110  39 - 117 U/L   Total Bilirubin <0.2 (*) 0.3 - 1.2 mg/dL   GFR calc non Af Amer 82 (*) >90 mL/min   GFR calc Af Amer >90  >90 mL/min   Comment: (NOTE)     The eGFR has been calculated using the CKD EPI equation.     This calculation has not been validated in all clinical situations.     eGFR's persistently <90 mL/min signify possible Chronic Kidney     Disease.   Anion gap 11  5 - 15  LACTATE DEHYDROGENASE     Status: Abnormal   Collection Time    09/23/13  5:43 AM      Result Value Ref Range   LDH 328 (*) 94 - 250 U/L  URIC ACID     Status: Abnormal   Collection Time    09/23/13  5:43 AM      Result Value Ref Range   Uric Acid, Serum 8.3 (*) 2.4 - 7.0 mg/dL  MAGNESIUM     Status: Abnormal   Collection Time    09/23/13  5:43 AM      Result Value Ref Range   Magnesium 6.6 (*) 1.5 - 2.5 mg/dL   Comment: REPEATED TO VERIFY     CRITICAL RESULT CALLED TO, READ BACK BY AND VERIFIED WITH:     BEARD,A. _0  ON 09/23/2013 BY BOVELL,A.     RESULT CONFIRMED BY AUTOMATED DILUTION  MAGNESIUM     Status: Abnormal   Collection Time    09/23/13  2:10 PM      Result Value Ref Range   Magnesium 6.4 (*) 1.5 - 2.5 mg/dL   Comment: REPEATED TO VERIFY     CRITICAL RESULT CALLED TO, READ BACK BY AND VERIFIED WITH:     A.BEARD AT 1520 ON 7.24.15 BY  JPEEBLES   FHT 120's, minimum variability, non accel, no decel.        A: 56w6dwith severe pre eclampsia   P: Consulted with Dr KAlesia Richards     Decreased magnesium from 1gm hr to .5gms hr.      [place] Mag [place] at 1900      MDs will follow  VSallee Provencal CNM, MSN 09/23/2013. 4:00 PM

## 2013-09-23 NOTE — Progress Notes (Signed)
Patient back to bed after washing up in BR. Monitors reapplied and patient fell asleep immediately. Will hold medication for nausea and HA until patient awakens.

## 2013-09-23 NOTE — Progress Notes (Signed)
Venus Standard CNM aware that patient's 24 hour urine collection is approximately 200cc at present. Pt. OOB to BR for the first time since 0815 this am.

## 2013-09-23 NOTE — Progress Notes (Signed)
Patient c/o of HA and nausea. Requesting to get up to BR. Patient is currently dry heaving and involuntarily voiding on bed with heaves.

## 2013-09-23 NOTE — Progress Notes (Signed)
OB PN:  S: At bedside to review management plan-Pt resting comfortably in bed.  No headache, no blurry vision, no RUQ pain.  +FM, no contractions, LOF, VB.  Pt had vomiting earlier today, but doing better now.  O: BP 137/89  Pulse 62  Temp(Src) 97.8 F (36.6 C) (Oral)  Resp 20  Ht 5\' 3"  (1.6 m)  Wt 125.964 kg (277 lb 11.2 oz)  BMI 49.20 kg/m2  SpO2 98% BP range: 137-164/77-95 (since 1200) UOP: inaccurate due to incontinence, 300cc8hr  Gen: NAD CV: RRR Lungs: CTAB Abd: soft, gravid, non-tender, no RUQ pain Ext: 2+ edema with 1 beat clonus, DTRs 1+ bilaterally, SCDs in place, no calf tenderness bilaterally FHT: 125, moderate variability, no accels, no decels Toco: no contractions SVE: deferred  CBC Latest Ref Rng 09/23/2013 09/23/2013 09/22/2013  WBC 4.0 - 10.5 K/uL 17.2(H) 14.9(H) 11.6(H)  Hemoglobin 12.0 - 15.0 g/dL 16.113.4 09.614.6 04.513.3  Hematocrit 36.0 - 46.0 % 40.3 43.2 39.6  Platelets 150 - 400 K/uL 263 243 218   7/24: @ 1900: BMP: 132/5.2, 99/22, 28/0.95<131 AST/ALT: 10/11 Uric acid: 9 LDH: 282  @ 0600: BMP: 133/6.7, 10220, 26/0.91 AST/ALT: 11/11 Uric acid: 8.3 LDH: 328  24hr urine: pending  A/P: 33yo [id number] @ 631w6d admitted for severe preeclampsia 1) FWB reassuring for gestational age, pt on continuous monitoring BMZ course 7/23-7/24 US performed today: 817g (27%) elevated dopplers, but without AEDF or REDF S/p NICU consult  2) Severe preeclampsia -Pt currently on Mag 0.5g/hr, Mag level stable as above -Strict I/Os with daily weights -Labs q 12hr, stable as above, if remain stable will decrease to daily -Labetalol 600mg  tid (per MFM may titrate up to 800mg  q8hr) -currently asymptomatic, pt receiving IV Dilaudid prn headache  3) Low lying placenta- delivery route to be C-section, will review with MFM  4) Maternal management Bedrest with bathroom privileges -SCDs for DVT prophylaxis -PNV and Colace daily  DISPO: Per MFM recommendations: will plan to move  toward delivery if worsening of symptoms: headache not relieved with pain medications, worsening of BP despite medications, HELLP, pulmonary edema, Cr >1.2 or non-reassuring fetal status.  For now, patient remains stable, will reassess and discuss management plan once pt is steroid complete.  Pt aware of above plan.  Myna HidalgoJennifer Rani Idler, DO [phone] (pager) [phone] (office)

## 2013-09-23 NOTE — Progress Notes (Signed)
CRITICAL VALUE ALERT  Critical value received:  Mag level Date of notification:  09/23/13  Time of notification:  1520  Critical value read back:Yes.    Nurse who received alert:  Audree CamelAmy Yashua Bracco  MD notified (1st page):  1540  Time of first page:  n/a  MD notified (2nd page):  Time of second page:  Responding MD:  Venus Standard CNM  Time MD responded:  1540

## 2013-09-23 NOTE — Progress Notes (Signed)
Hospital day # 1 pregnancy at [redacted]w[redacted]d-with severe pre eclampsia.  S:       C/O Nausea with occassional vomiting and a HA  O: BP 151/82  Pulse 67  Temp(Src) 97.8 F (36.6 C) (Oral)  Resp 20  Ht 5' 3"  (1.6 m)  Wt 277 lb 11.2 oz (125.964 kg)  BMI 49.20 kg/m2  SpO2 98%          Uterus gravid and non-tender      Extremities: extremities normal, atraumatic, no cyanosis or edema and no significant edema and no signs of DVT          Recent Results (from the past 2160 hour(s))  URINALYSIS, ROUTINE W REFLEX MICROSCOPIC     Status: Abnormal   Collection Time    09/22/13 11:30 AM      Result Value Ref Range   Color, Urine YELLOW  YELLOW   APPearance CLOUDY (*) CLEAR   Specific Gravity, Urine 1.020  1.005 - 1.030   pH 6.5  5.0 - 8.0   Glucose, UA NEGATIVE  NEGATIVE mg/dL   Hgb urine dipstick MODERATE (*) NEGATIVE   Bilirubin Urine SMALL (*) NEGATIVE   Ketones, ur NEGATIVE  NEGATIVE mg/dL   Protein, ur >300 (*) NEGATIVE mg/dL   Urobilinogen, UA 0.2  0.0 - 1.0 mg/dL   Nitrite NEGATIVE  NEGATIVE   Leukocytes, UA NEGATIVE  NEGATIVE  URINE MICROSCOPIC-ADD ON     Status: Abnormal   Collection Time    09/22/13 11:30 AM      Result Value Ref Range   Squamous Epithelial / LPF MANY (*) RARE   WBC, UA 0-2  <3 WBC/hpf   RBC / HPF 0-2  <3 RBC/hpf   Bacteria, UA MANY (*) RARE  PROTEIN / CREATININE RATIO, URINE     Status: None   Collection Time    09/22/13 11:30 AM      Result Value Ref Range   Creatinine, Urine 363.67     Total Protein, Urine >5000     Comment: NO NORMAL RANGE ESTABLISHED FOR THIS TEST     RESULTS CONFIRMED BY MANUAL DILUTION   PROTEIN CREATININE RATIO         0.00 - 0.15   Comment: RESULT BELOW REPORTABLE RANGE,     UNABLE TO CALCULATE.  COMPREHENSIVE METABOLIC PANEL     Status: Abnormal   Collection Time    09/22/13  1:10 PM      Result Value Ref Range   Sodium 137  137 - 147 mEq/L   Potassium 5.6 (*) 3.7 - 5.3 mEq/L   Chloride 105  96 - 112 mEq/L   CO2 21  19 -  32 mEq/L   Glucose, Bld 83  70 - 99 mg/dL   BUN 19  6 - 23 mg/dL   Creatinine, Ser 0.93  0.50 - 1.10 mg/dL   Calcium 8.9  8.4 - 10.5 mg/dL   Total Protein 6.0  6.0 - 8.3 g/dL   Albumin 1.9 (*) 3.5 - 5.2 g/dL   AST 13  0 - 37 U/L   ALT 11  0 - 35 U/L   Alkaline Phosphatase 101  39 - 117 U/L   Total Bilirubin <0.2 (*) 0.3 - 1.2 mg/dL   GFR calc non Af Amer 80 (*) >90 mL/min   GFR calc Af Amer >90  >90 mL/min   Comment: (NOTE)     The eGFR has been calculated using the CKD EPI equation.  This calculation has not been validated in all clinical situations.     eGFR's persistently <90 mL/min signify possible Chronic Kidney     Disease.   Anion gap 11  5 - 15  CBC     Status: Abnormal   Collection Time    09/22/13  1:10 PM      Result Value Ref Range   WBC 11.6 (*) 4.0 - 10.5 K/uL   RBC 4.69  3.87 - 5.11 MIL/uL   Hemoglobin 13.3  12.0 - 15.0 g/dL   HCT 39.6  36.0 - 46.0 %   MCV 84.4  78.0 - 100.0 fL   MCH 28.4  26.0 - 34.0 pg   MCHC 33.6  30.0 - 36.0 g/dL   RDW 14.6  11.5 - 15.5 %   Platelets 218  150 - 400 K/uL  URIC ACID     Status: Abnormal   Collection Time    09/22/13  1:10 PM      Result Value Ref Range   Uric Acid, Serum 7.4 (*) 2.4 - 7.0 mg/dL  LACTATE DEHYDROGENASE     Status: Abnormal   Collection Time    09/22/13  1:10 PM      Result Value Ref Range   LDH 410 (*) 94 - 250 U/L  CBC     Status: Abnormal   Collection Time    09/23/13  5:43 AM      Result Value Ref Range   WBC 14.9 (*) 4.0 - 10.5 K/uL   RBC 5.16 (*) 3.87 - 5.11 MIL/uL   Hemoglobin 14.6  12.0 - 15.0 g/dL   HCT 43.2  36.0 - 46.0 %   MCV 83.7  78.0 - 100.0 fL   MCH 28.3  26.0 - 34.0 pg   MCHC 33.8  30.0 - 36.0 g/dL   RDW 14.7  11.5 - 15.5 %   Platelets 243  150 - 400 K/uL  COMPREHENSIVE METABOLIC PANEL     Status: Abnormal   Collection Time    09/23/13  5:43 AM      Result Value Ref Range   Sodium 133 (*) 137 - 147 mEq/L   Potassium 6.7 (*) 3.7 - 5.3 mEq/L   Comment: NO VISIBLE HEMOLYSIS      REPEATED TO VERIFY     CRITICAL RESULT CALLED TO, READ BACK BY AND VERIFIED WITH:     DAVIS,A. @0649  ON 09/23/2013 BY BOVELL,A.   Chloride 102  96 - 112 mEq/L   CO2 20  19 - 32 mEq/L   Glucose, Bld 142 (*) 70 - 99 mg/dL   BUN 26 (*) 6 - 23 mg/dL   Creatinine, Ser 0.91  0.50 - 1.10 mg/dL   Calcium 8.7  8.4 - 10.5 mg/dL   Total Protein 6.5  6.0 - 8.3 g/dL   Albumin 2.0 (*) 3.5 - 5.2 g/dL   AST 11  0 - 37 U/L   ALT 11  0 - 35 U/L   Alkaline Phosphatase 110  39 - 117 U/L   Total Bilirubin <0.2 (*) 0.3 - 1.2 mg/dL   GFR calc non Af Amer 82 (*) >90 mL/min   GFR calc Af Amer >90  >90 mL/min   Comment: (NOTE)     The eGFR has been calculated using the CKD EPI equation.     This calculation has not been validated in all clinical situations.     eGFR's persistently <90 mL/min signify possible Chronic Kidney  Disease.   Anion gap 11  5 - 15  LACTATE DEHYDROGENASE     Status: Abnormal   Collection Time    09/23/13  5:43 AM      Result Value Ref Range   LDH 328 (*) 94 - 250 U/L  URIC ACID     Status: Abnormal   Collection Time    09/23/13  5:43 AM      Result Value Ref Range   Uric Acid, Serum 8.3 (*) 2.4 - 7.0 mg/dL  MAGNESIUM     Status: Abnormal   Collection Time    09/23/13  5:43 AM      Result Value Ref Range   Magnesium 6.6 (*) 1.5 - 2.5 mg/dL   Comment: REPEATED TO VERIFY     CRITICAL RESULT CALLED TO, READ BACK BY AND VERIFIED WITH:     BEARD,A. @0745  ON 09/23/2013 BY BOVELL,A.     RESULT CONFIRMED BY AUTOMATED DILUTION           A: 71w6dwith severe pre eclampsia       P: Consult with Dr KAlesia Richards    Decrease Magnesium from 2gms to 1 gm     Decrease Labetalol from 6014mtid to 60047mid     MFM will follow    Nihar Klus, CNM, MSN 09/23/2013. 9:38 AM

## 2013-09-23 NOTE — Progress Notes (Signed)
Patient ID: Amanda Ryan, female   DOB: 1980-08-04, 33 y.o.   MRN: [id number] S: Pt. Complaining of  nausea and vomiting this am.  Also with a headache that has improved with IV dilaudid use.  Denies chest pain/Shortness of breath/abdominal pain/vaginal bleeding/contractions. With gross fetal movement.   O:  Vitals: 98.4 76 156/93 (101-194)/ (47-112) P 67 Gen: NAD.  Drousy. CVS: s1, S2, RRR Pulm: CTAB Abd:S/NT/Gravid EXT: WWP, 2 + edema BL in upper and lower extremities. No Calf tenderness or erythema.  2+ patellar reflexes BL.   EFM: 140 BL, minimal variability.  No accels.  No decels.   LABS: Signifcant 7/24:  K 6.7  Mg 6.6 Cr 0.91 Uric acid 7.4 A/P:   33 y/o P3 with hx of CHTN, now with severe preeclampsia -2nd Beta this PM.  Discussed with patient Beta completion will be tomorrow about 2 pm.  -24 hr urine collection for protein finishing up this PM, f/u on results. -serial labs including K+. -Decrease Mag to 1g/hr,check Mg level at about 2 pm today.  -Discussed with patient management plan, mode of delivery, will need cervical exam then and also assessment of maternal and fetal status when decision made to deliver to determine route of delivery.   -Given some low BPs, decrease labetalol frequency to BID,close BP follow up.

## 2013-09-23 NOTE — Progress Notes (Signed)
Venus Standard CNM and Dr. Sallye OberKulwa aware that 24 hour urine collection amount is incomplete due to patient voiding in the bed while vomiting this am and last night.

## 2013-09-23 NOTE — Progress Notes (Signed)
CRITICAL VALUE ALERT  Critical value received:  Magnesium Level  Date of notification:  09/23/13  Time of notification:  0745  Critical value read back:Yes.    Nurse who received alert:  Abbott Jasinski  MD notified (1st page):  [phone]  Time of first page:  n/a  MD notified (2nd page):  Time of second page:  Responding MD:  Venus Standard CNM  Time MD responded:  [phone]

## 2013-09-24 ENCOUNTER — Encounter (HOSPITAL_COMMUNITY)
Admission: AD | Disposition: A | Discharge status: Home / Self Care | Payer: Self-pay | Source: Ambulatory Visit | Attending: Obstetrics and Gynecology

## 2013-09-24 ENCOUNTER — Inpatient Hospital Stay (HOSPITAL_COMMUNITY): Payer: BC Managed Care – PPO

## 2013-09-24 ENCOUNTER — Encounter (HOSPITAL_COMMUNITY): Payer: BC Managed Care – PPO

## 2013-09-24 ENCOUNTER — Encounter (HOSPITAL_COMMUNITY): Payer: Self-pay | Admitting: *Deleted

## 2013-09-24 LAB — PROTEIN, URINE, 24 HOUR
Collection Interval-UPROT: 24 hours
PROTEIN 24H UR: 10987 mg/d — AB (ref 50–100)
PROTEIN, URINE: 2313 mg/dL
Urine Total Volume-UPROT: 475 mL

## 2013-09-24 LAB — CBC
HCT: 36.6 % (ref 36.0–46.0)
HEMATOCRIT: 37.8 % (ref 36.0–46.0)
HEMOGLOBIN: 11.8 g/dL — AB (ref 12.0–15.0)
Hemoglobin: 12.1 g/dL (ref 12.0–15.0)
MCH: 27.4 pg (ref 26.0–34.0)
MCH: 27.6 pg (ref 26.0–34.0)
MCHC: 32 g/dL (ref 30.0–36.0)
MCHC: 32.2 g/dL (ref 30.0–36.0)
MCV: 85.5 fL (ref 78.0–100.0)
MCV: 85.5 fL (ref 78.0–100.0)
Platelets: 245 10*3/uL (ref 150–400)
Platelets: 234 10*3/uL (ref 150–400)
RBC: 4.42 MIL/uL (ref 3.87–5.11)
RBC: 4.28 MIL/uL (ref 3.87–5.11)
RDW: 15.3 % (ref 11.5–15.5)
RDW: 15.4 % (ref 11.5–15.5)
WBC: 17.2 10*3/uL — ABNORMAL HIGH (ref 4.0–10.5)
WBC: 16.7 10*3/uL — ABNORMAL HIGH (ref 4.0–10.5)

## 2013-09-24 LAB — COMPREHENSIVE METABOLIC PANEL
ALT: 12 U/L (ref 0–35)
ANION GAP: 11 (ref 5–15)
AST: 14 U/L (ref 0–37)
Albumin: 1.8 g/dL — ABNORMAL LOW (ref 3.5–5.2)
Alkaline Phosphatase: 124 U/L — ABNORMAL HIGH (ref 39–117)
BUN: 35 mg/dL — AB (ref 6–23)
CO2: 22 mEq/L (ref 19–32)
CREATININE: 1.06 mg/dL (ref 0.50–1.10)
Calcium: 8.3 mg/dL — ABNORMAL LOW (ref 8.4–10.5)
Chloride: 104 mEq/L (ref 96–112)
GFR calc Af Amer: 79 mL/min — ABNORMAL LOW (ref >90)
GFR calc non Af Amer: 68 mL/min — ABNORMAL LOW (ref >90)
Glucose, Bld: 131 mg/dL — ABNORMAL HIGH (ref 70–99)
POTASSIUM: 5.5 meq/L — AB (ref 3.7–5.3)
Sodium: 137 mEq/L (ref 137–147)
TOTAL PROTEIN: 5.3 g/dL — AB (ref 6.0–8.3)
Total Bilirubin: <0.2 mg/dL — ABNORMAL LOW (ref 0.3–1.2)

## 2013-09-24 LAB — ABO/RH: ABO/RH(D): O POS

## 2013-09-24 LAB — URIC ACID: URIC ACID, SERUM: 8.8 mg/dL — AB (ref 2.4–7.0)

## 2013-09-24 LAB — PREPARE RBC (CROSSMATCH)

## 2013-09-24 SURGERY — Surgical Case
Anesthesia: Epidural

## 2013-09-24 MED ORDER — ZOLPIDEM TARTRATE 5 MG PO TABS: 5.0000 mg | ORAL_TABLET | Freq: Every evening | ORAL | Status: DC | PRN

## 2013-09-24 MED ORDER — LABETALOL HCL 5 MG/ML IV SOLN
INTRAVENOUS | Status: AC
  Filled 2013-09-24: qty 4

## 2013-09-24 MED ORDER — KETOROLAC TROMETHAMINE 30 MG/ML IJ SOLN
30.0000 mg | Freq: Four times a day (QID) | INTRAMUSCULAR | Status: AC | PRN
  Administered 2013-09-24: 30 mg via INTRAVENOUS

## 2013-09-24 MED ORDER — LACTATED RINGERS IV SOLN
INTRAVENOUS | Status: DC | PRN
  Administered 2013-09-24: 17:00:00 via INTRAVENOUS

## 2013-09-24 MED ORDER — MEPERIDINE HCL 25 MG/ML IJ SOLN: 6.2500 mg | INTRAMUSCULAR | Status: DC | PRN

## 2013-09-24 MED ORDER — ONDANSETRON HCL 4 MG/2ML IJ SOLN: 4.0000 mg | Freq: Three times a day (TID) | INTRAMUSCULAR | Status: DC | PRN

## 2013-09-24 MED ORDER — MENTHOL 3 MG MT LOZG: 1.0000 | LOZENGE | OROMUCOSAL | Status: DC | PRN

## 2013-09-24 MED ORDER — ONDANSETRON HCL 4 MG/2ML IJ SOLN
INTRAMUSCULAR | Status: AC
  Filled 2013-09-24: qty 2

## 2013-09-24 MED ORDER — PRENATAL MULTIVITAMIN CH
1.0000 | ORAL_TABLET | Freq: Every day | ORAL | Status: DC
  Administered 2013-09-25 – 2013-09-28 (×4): 1 via ORAL
  Filled 2013-09-24 (×5): qty 1

## 2013-09-24 MED ORDER — MORPHINE SULFATE 0.5 MG/ML IJ SOLN
INTRAMUSCULAR | Status: AC
  Filled 2013-09-24: qty 10

## 2013-09-24 MED ORDER — SCOPOLAMINE 1 MG/3DAYS TD PT72
MEDICATED_PATCH | TRANSDERMAL | Status: AC
  Filled 2013-09-24: qty 1

## 2013-09-24 MED ORDER — KETOROLAC TROMETHAMINE 30 MG/ML IJ SOLN: 30.0000 mg | Freq: Four times a day (QID) | INTRAMUSCULAR | Status: AC | PRN

## 2013-09-24 MED ORDER — SODIUM CHLORIDE 0.9 % IJ SOLN: 3.0000 mL | INTRAMUSCULAR | Status: DC | PRN

## 2013-09-24 MED ORDER — BUPIVACAINE IN DEXTROSE 0.75-8.25 % IT SOLN
INTRATHECAL | Status: AC
  Filled 2013-09-24: qty 2

## 2013-09-24 MED ORDER — LABETALOL HCL 300 MG PO TABS
600.0000 mg | ORAL_TABLET | Freq: Three times a day (TID) | ORAL | Status: DC
  Administered 2013-09-25: 600 mg via ORAL
  Filled 2013-09-24 (×2): qty 2

## 2013-09-24 MED ORDER — LANOLIN HYDROUS EX OINT: 1.0000 "application " | TOPICAL_OINTMENT | CUTANEOUS | Status: DC | PRN

## 2013-09-24 MED ORDER — SCOPOLAMINE 1 MG/3DAYS TD PT72
1.0000 | MEDICATED_PATCH | Freq: Once | TRANSDERMAL | Status: DC
  Filled 2013-09-24: qty 1

## 2013-09-24 MED ORDER — LACTATED RINGERS IV SOLN: INTRAVENOUS | Status: DC | PRN

## 2013-09-24 MED ORDER — MISOPROSTOL 200 MCG PO TABS
ORAL_TABLET | ORAL | Status: AC
  Filled 2013-09-24: qty 5

## 2013-09-24 MED ORDER — KETOROLAC TROMETHAMINE 30 MG/ML IJ SOLN
INTRAMUSCULAR | Status: AC
  Filled 2013-09-24: qty 1

## 2013-09-24 MED ORDER — SIMETHICONE 80 MG PO CHEW
80.0000 mg | CHEWABLE_TABLET | ORAL | Status: DC
  Administered 2013-09-25 – 2013-09-27 (×4): 80 mg via ORAL
  Filled 2013-09-24 (×4): qty 1

## 2013-09-24 MED ORDER — OXYTOCIN 10 UNIT/ML IJ SOLN
INTRAMUSCULAR | Status: AC
  Filled 2013-09-24: qty 4

## 2013-09-24 MED ORDER — DIPHENHYDRAMINE HCL 50 MG/ML IJ SOLN
INTRAMUSCULAR | Status: AC
Start: 2013-09-24 — End: 2013-09-25
  Filled 2013-09-24: qty 1

## 2013-09-24 MED ORDER — SCOPOLAMINE 1 MG/3DAYS TD PT72
MEDICATED_PATCH | TRANSDERMAL | Status: DC | PRN
  Administered 2013-09-24: 1 via TRANSDERMAL

## 2013-09-24 MED ORDER — ONDANSETRON HCL 4 MG PO TABS: 4.0000 mg | ORAL_TABLET | ORAL | Status: DC | PRN

## 2013-09-24 MED ORDER — METOCLOPRAMIDE HCL 5 MG/ML IJ SOLN: 10.0000 mg | Freq: Three times a day (TID) | INTRAMUSCULAR | Status: DC | PRN

## 2013-09-24 MED ORDER — FENTANYL CITRATE 0.05 MG/ML IJ SOLN
INTRAMUSCULAR | Status: DC | PRN
  Administered 2013-09-24 (×4): 25 ug via INTRAVENOUS

## 2013-09-24 MED ORDER — SENNOSIDES-DOCUSATE SODIUM 8.6-50 MG PO TABS
2.0000 | ORAL_TABLET | ORAL | Status: DC
  Administered 2013-09-25 – 2013-09-27 (×3): 2 via ORAL
  Filled 2013-09-24 (×4): qty 2

## 2013-09-24 MED ORDER — IBUPROFEN 600 MG PO TABS
600.0000 mg | ORAL_TABLET | Freq: Four times a day (QID) | ORAL | Status: DC
  Administered 2013-09-25 – 2013-09-28 (×14): 600 mg via ORAL
  Filled 2013-09-24 (×14): qty 1

## 2013-09-24 MED ORDER — OXYTOCIN 10 UNIT/ML IJ SOLN
40.0000 [IU] | INTRAVENOUS | Status: DC | PRN
  Administered 2013-09-24: 40 [IU] via INTRAVENOUS

## 2013-09-24 MED ORDER — PHENYLEPHRINE 8 MG IN D5W 100 ML (0.08MG/ML) PREMIX OPTIME
INJECTION | INTRAVENOUS | Status: AC
  Filled 2013-09-24: qty 100

## 2013-09-24 MED ORDER — FENTANYL CITRATE 0.05 MG/ML IJ SOLN
INTRAMUSCULAR | Status: AC
  Filled 2013-09-24: qty 2

## 2013-09-24 MED ORDER — NALBUPHINE HCL 10 MG/ML IJ SOLN: 5.0000 mg | INTRAMUSCULAR | Status: DC | PRN

## 2013-09-24 MED ORDER — SIMETHICONE 80 MG PO CHEW
80.0000 mg | CHEWABLE_TABLET | Freq: Three times a day (TID) | ORAL | Status: DC
  Administered 2013-09-25 – 2013-09-27 (×7): 80 mg via ORAL
  Filled 2013-09-24 (×8): qty 1

## 2013-09-24 MED ORDER — DIPHENHYDRAMINE HCL 25 MG PO CAPS: 25.0000 mg | ORAL_CAPSULE | Freq: Four times a day (QID) | ORAL | Status: DC | PRN

## 2013-09-24 MED ORDER — ONDANSETRON HCL 4 MG/2ML IJ SOLN
INTRAMUSCULAR | Status: DC | PRN
  Administered 2013-09-24: 4 mg via INTRAVENOUS

## 2013-09-24 MED ORDER — ONDANSETRON HCL 4 MG/2ML IJ SOLN: 4.0000 mg | INTRAMUSCULAR | Status: DC | PRN

## 2013-09-24 MED ORDER — OXYTOCIN 40 UNITS IN LACTATED RINGERS INFUSION - SIMPLE MED: 62.5000 mL/h | INTRAVENOUS | Status: AC

## 2013-09-24 MED ORDER — LABETALOL HCL 5 MG/ML IV SOLN
10.0000 mg | INTRAVENOUS | Status: DC | PRN
  Administered 2013-09-24: 20 mg via INTRAVENOUS
  Administered 2013-09-24 – 2013-09-27 (×4): 10 mg via INTRAVENOUS
  Filled 2013-09-24 (×4): qty 4

## 2013-09-24 MED ORDER — DIPHENHYDRAMINE HCL 25 MG PO CAPS
25.0000 mg | ORAL_CAPSULE | ORAL | Status: DC | PRN
Start: 2013-09-24 — End: 2013-09-28
  Administered 2013-09-25 (×3): 25 mg via ORAL
  Filled 2013-09-24 (×4): qty 1

## 2013-09-24 MED ORDER — TETANUS-DIPHTH-ACELL PERTUSSIS 5-2.5-18.5 LF-MCG/0.5 IM SUSP
0.5000 mL | Freq: Once | INTRAMUSCULAR | Status: DC
  Filled 2013-09-24: qty 0.5

## 2013-09-24 MED ORDER — DIPHENHYDRAMINE HCL 50 MG/ML IJ SOLN: 25.0000 mg | INTRAMUSCULAR | Status: DC | PRN

## 2013-09-24 MED ORDER — PHENYLEPHRINE 8 MG IN D5W 100 ML (0.08MG/ML) PREMIX OPTIME
INJECTION | INTRAVENOUS | Status: DC | PRN
  Administered 2013-09-24: 20 ug/min via INTRAVENOUS

## 2013-09-24 MED ORDER — MAGNESIUM SULFATE 40 G IN LACTATED RINGERS - SIMPLE
1.0000 g/h | INTRAVENOUS | Status: AC
  Administered 2013-09-24: 1 g/h via INTRAVENOUS
  Filled 2013-09-24: qty 500

## 2013-09-24 MED ORDER — DIPHENHYDRAMINE HCL 50 MG/ML IJ SOLN
12.5000 mg | INTRAMUSCULAR | Status: DC | PRN
  Administered 2013-09-24: 12.5 mg via INTRAVENOUS

## 2013-09-24 MED ORDER — MORPHINE SULFATE (PF) 0.5 MG/ML IJ SOLN
INTRAMUSCULAR | Status: DC | PRN
  Administered 2013-09-24: .15 mg via INTRATHECAL
  Administered 2013-09-24: 3 mg via EPIDURAL

## 2013-09-24 MED ORDER — DEXTROSE 5 % IV SOLN
2.0000 g | INTRAVENOUS | Status: AC
  Administered 2013-09-24: 2 g via INTRAVENOUS
  Filled 2013-09-24 (×2): qty 2

## 2013-09-24 MED ORDER — NALOXONE HCL 0.4 MG/ML IJ SOLN: 0.4000 mg | INTRAMUSCULAR | Status: DC | PRN

## 2013-09-24 MED ORDER — DIBUCAINE 1 % RE OINT: 1.0000 "application " | TOPICAL_OINTMENT | RECTAL | Status: DC | PRN

## 2013-09-24 MED ORDER — LACTATED RINGERS IV SOLN
INTRAVENOUS | Status: DC
Start: 2013-09-24 — End: 2013-09-28
  Administered 2013-09-25 (×2): via INTRAVENOUS

## 2013-09-24 MED ORDER — LACTATED RINGERS IV SOLN
INTRAVENOUS | Status: DC | PRN
  Administered 2013-09-24: 16:00:00 via INTRAVENOUS

## 2013-09-24 MED ORDER — WITCH HAZEL-GLYCERIN EX PADS: 1.0000 "application " | MEDICATED_PAD | CUTANEOUS | Status: DC | PRN

## 2013-09-24 MED ORDER — CITRIC ACID-SODIUM CITRATE 334-500 MG/5ML PO SOLN
ORAL | Status: AC
  Filled 2013-09-24: qty 15

## 2013-09-24 MED ORDER — CITRIC ACID-SODIUM CITRATE 334-500 MG/5ML PO SOLN
30.0000 mL | Freq: Once | ORAL | Status: AC
  Administered 2013-09-24: 30 mL via ORAL

## 2013-09-24 MED ORDER — NALOXONE HCL 1 MG/ML IJ SOLN
1.0000 ug/kg/h | INTRAVENOUS | Status: DC | PRN
  Filled 2013-09-24: qty 2

## 2013-09-24 MED ORDER — FENTANYL CITRATE 0.05 MG/ML IJ SOLN
25.0000 ug | INTRAMUSCULAR | Status: DC | PRN
  Administered 2013-09-24 (×2): 50 ug via INTRAVENOUS

## 2013-09-24 MED ORDER — SIMETHICONE 80 MG PO CHEW: 80.0000 mg | CHEWABLE_TABLET | ORAL | Status: DC | PRN

## 2013-09-24 MED ORDER — OXYCODONE-ACETAMINOPHEN 5-325 MG PO TABS
1.0000 | ORAL_TABLET | ORAL | Status: DC | PRN
Start: 2013-09-24 — End: 2013-09-28
  Administered 2013-09-25: 2 via ORAL
  Administered 2013-09-25 – 2013-09-26 (×5): 1 via ORAL
  Administered 2013-09-26: 2 via ORAL
  Administered 2013-09-26: 1 via ORAL
  Administered 2013-09-26 – 2013-09-27 (×6): 2 via ORAL
  Administered 2013-09-28: 1 via ORAL
  Filled 2013-09-24 (×3): qty 2
  Filled 2013-09-24: qty 1
  Filled 2013-09-24: qty 2
  Filled 2013-09-24: qty 1
  Filled 2013-09-24 (×3): qty 2
  Filled 2013-09-24: qty 1
  Filled 2013-09-24 (×2): qty 2
  Filled 2013-09-24: qty 1
  Filled 2013-09-24: qty 2
  Filled 2013-09-24: qty 1

## 2013-09-24 SURGICAL SUPPLY — 39 items
BARRIER ADHS 3X4 INTERCEED (GAUZE/BANDAGES/DRESSINGS) ×3 IMPLANT
BENZOIN TINCTURE PRP APPL 2/3 (GAUZE/BANDAGES/DRESSINGS) ×3 IMPLANT
BLADE SURG 10 STRL SS (BLADE) ×6 IMPLANT
CLAMP CORD UMBIL (MISCELLANEOUS) IMPLANT
CLOSURE WOUND 1/2 X4 (GAUZE/BANDAGES/DRESSINGS) ×1
CLOTH BEACON ORANGE TIMEOUT ST (SAFETY) ×3 IMPLANT
DERMABOND ADVANCED (GAUZE/BANDAGES/DRESSINGS)
DERMABOND ADVANCED .7 DNX12 (GAUZE/BANDAGES/DRESSINGS) IMPLANT
DRAPE LG THREE QUARTER DISP (DRAPES) IMPLANT
DRSG OPSITE POSTOP 4X10 (GAUZE/BANDAGES/DRESSINGS) ×3 IMPLANT
DURAPREP 26ML APPLICATOR (WOUND CARE) ×3 IMPLANT
ELECT REM PT RETURN 9FT ADLT (ELECTROSURGICAL) ×3
ELECTRODE REM PT RTRN 9FT ADLT (ELECTROSURGICAL) ×1 IMPLANT
EXTRACTOR VACUUM KIWI (MISCELLANEOUS) IMPLANT
GLOVE BIOGEL PI IND STRL 6.5 (GLOVE) ×1 IMPLANT
GLOVE BIOGEL PI INDICATOR 6.5 (GLOVE) ×2
GLOVE ECLIPSE 6.5 STRL STRAW (GLOVE) ×3 IMPLANT
GOWN STRL REUS W/TWL LRG LVL3 (GOWN DISPOSABLE) ×6 IMPLANT
KIT ABG SYR 3ML LUER SLIP (SYRINGE) IMPLANT
NEEDLE HYPO 25X5/8 SAFETYGLIDE (NEEDLE) IMPLANT
NS IRRIG 1000ML POUR BTL (IV SOLUTION) ×3 IMPLANT
PACK C SECTION WH (CUSTOM PROCEDURE TRAY) ×3 IMPLANT
PAD ABD 7.5X8 STRL (GAUZE/BANDAGES/DRESSINGS) ×3 IMPLANT
PAD ABD 8X10 STRL (GAUZE/BANDAGES/DRESSINGS) ×3 IMPLANT
PAD OB MATERNITY 4.3X12.25 (PERSONAL CARE ITEMS) ×3 IMPLANT
RTRCTR C-SECT PINK 25CM LRG (MISCELLANEOUS) ×3 IMPLANT
STRIP CLOSURE SKIN 1/2X4 (GAUZE/BANDAGES/DRESSINGS) ×2 IMPLANT
SUT PLAIN 0 NONE (SUTURE) IMPLANT
SUT PLAIN 2 0 XLH (SUTURE) ×6 IMPLANT
SUT VIC AB 0 CT1 27 (SUTURE) ×4
SUT VIC AB 0 CT1 27XBRD ANBCTR (SUTURE) ×2 IMPLANT
SUT VIC AB 0 CTX 36 (SUTURE) ×8
SUT VIC AB 0 CTX36XBRD ANBCTRL (SUTURE) ×4 IMPLANT
SUT VIC AB 2-0 SH 27 (SUTURE) ×2
SUT VIC AB 2-0 SH 27XBRD (SUTURE) ×1 IMPLANT
SUT VIC AB 4-0 KS 27 (SUTURE) ×3 IMPLANT
TOWEL OR 17X24 6PK STRL BLUE (TOWEL DISPOSABLE) ×3 IMPLANT
TRAY FOLEY CATH 14FR (SET/KITS/TRAYS/PACK) IMPLANT
WATER STERILE IRR 1000ML POUR (IV SOLUTION) ×3 IMPLANT

## 2013-09-24 NOTE — Anesthesia Preprocedure Evaluation (Signed)
Anesthesia Evaluation  Patient identified by MRN, date of birth, ID band Patient awake    Reviewed: Allergy & Precautions, H&P , Patient's Chart, lab work & pertinent test results  Airway Mallampati: II TM Distance: >3 FB Neck ROM: full    Dental no notable dental hx.    Pulmonary  breath sounds clear to auscultation  Pulmonary exam normal       Cardiovascular Exercise Tolerance: Good hypertension, Rhythm:regular Rate:Normal     Neuro/Psych    GI/Hepatic   Endo/Other  Morbid obesity  Renal/GU      Musculoskeletal   Abdominal   Peds  Hematology   Anesthesia Other Findings   Reproductive/Obstetrics                           Anesthesia Physical Anesthesia Plan  ASA: III  Anesthesia Plan: Spinal, Combined Spinal and Epidural and Epidural   Post-op Pain Management:    Induction:   Airway Management Planned:   Additional Equipment:   Intra-op Plan:   Post-operative Plan:   Informed Consent: I have reviewed the patients History and Physical, chart, labs and discussed the procedure including the risks, benefits and alternatives for the proposed anesthesia with the patient or authorized representative who has indicated his/her understanding and acceptance.   Dental Advisory Given  Plan Discussed with: CRNA  Anesthesia Plan Comments: (Lab work confirmed with CRNA in room. Platelets okay. Discussed spinal anesthetic, and patient consents to the procedure:  included risk of possible headache,backache, failed block, allergic reaction, and nerve injury. This patient was asked if she had any questions or concerns before the procedure started. )        Anesthesia Quick Evaluation

## 2013-09-24 NOTE — Anesthesia Procedure Notes (Signed)
Spinal  Patient location during procedure: OR Preanesthetic Checklist Completed: patient identified, site marked, surgical consent, pre-op evaluation, timeout performed, IV checked, risks and benefits discussed and monitors and equipment checked Spinal Block Patient position: sitting Prep: DuraPrep Patient monitoring: cardiac monitor, continuous pulse ox, blood pressure and heart rate Approach: midline Location: L3-4 Injection technique: catheter Needle Needle type: Tuohy and Sprotte  Needle gauge: 24 G Needle length: 12.7 cm Needle insertion depth: 9 cm Catheter type: closed end flexible Catheter size: 19 g Catheter at skin depth: 16 cm Additional Notes Spinal Dosage in OR  Bupivicaine ml       1.4 PFMS04   mcg        150 (-) asp heme/CSF

## 2013-09-24 NOTE — Op Note (Signed)
PreOp Diagnosis: Intrauterine pregnancy @ 5258w0d, Severe preeclampsia, Low lying placenta PostOp Diagnosis: same Procedure: Low-transverse C-section Surgeon: Dr. Myna HidalgoJennifer Derrico Zhong Assist: Clyde LundborgEmily Jessica, CNM Anesthesia: combined spinal/epidural Complications: none EBL: 750cc UOP: 250cc Fluids: 1000cc  Findings: Female infant from vertex presentation, placenta intact, normal uterus tubes and ovaries bilaterally  PROCEDURE:  Informed consent was obtained from the patient with risks, benefits, complications, treatment options, and expected outcomes discussed with the patient.  The patient concurred with the proposed plan, giving informed consent with form signed.   The patient was taken to Operating Room, and identified with the procedure verified as C-Section Delivery with Time Out. With induction of anesthesia, the patient was prepped and draped in the usual sterile fashion. A Pfannenstiel incision was made and carried down through the subcutaneous tissue to the fascia. The fascia was incised in the midline and extended transversely. The superior aspect of the fascial incision was grasped with Kochers elevated and the underlying muscle dissected off. The inferior aspect of the facial incision was in similar fashion, grasped elevated and rectus muscles dissected off. The peritoneum was identified and entered. Peritoneal incision was extended longitudinally. The Alexis retractor was placed.  The utero-vesical peritoneal reflection was identified and incised transversely with the [facility] scissors, the incision extended laterally, the bladder flap created digitally. The lower uterine segment was assessed and noted to be developed.  A low transverse uterine incision was made and the infants head delivered atraumatically. After the umbilical cord was clamped and cut cord blood was obtained for evaluation.   The placenta was removed intact and appeared normal. The uterine outline, tubes and ovaries appeared normal.  The uterine incision was closed with running locked sutures of 0 Vicryl and a second layer of the same stitch was used in an imbricating fashion.  An additional layer of 0-vicryl was used to ensure excellent hemostasis. The pericolic gutters were then cleared of all clots and debris. Surgicel was placed over the hysterotomy and the Alexis retractor was removed.  The fascia was then reapproximated with running sutures of 0 Vicryl. The subcutaneous tissue was reapproximated with 2-0 plain gut suture in two layers.  The skin was closed with 4-0 vicryl in a subcuticular fashion.  Instrument, sponge, and needle counts were correct prior the abdominal closure and at the conclusion of the case. The patient was taken to recovery in stable condition.  Myna HidalgoJennifer Starletta Houchin, DO [phone] (pager) [phone] (office)

## 2013-09-24 NOTE — Progress Notes (Signed)
OB PN:  S: At bedside to review management plan-Pt resting comfortably in bed.  Slight headache this am, no blurry vision, no RUQ pain.  +FM, no contractions, LOF, VB.  Tolerating gen diet for breakfast  O: BP 174/105  Pulse 77  Temp(Src) 98 F (36.7 C) (Oral)  Resp 18  Ht 5\' 3"  (1.6 m)  Wt 129.547 kg (285 lb 9.6 oz)  BMI 50.60 kg/m2  SpO2 95% BP range: 144-174/73-105 UOP: 650cc/8hr  Gen: NAD CV: RRR Lungs: CTAB Abd: soft, gravid, non-tender, no RUQ pain Ext: 2+ edema with 1 beat clonus, DTRs 1+ bilaterally, SCDs in place, no calf tenderness bilaterally FHT: 125, moderate variability, +10x10 accels, no decels Toco: no contractions SVE: deferred  24hr urine: 10,987  CBC Latest Ref Rng 09/24/2013 09/23/2013 09/23/2013  WBC 4.0 - 10.5 K/uL 17.2(H) 17.2(H) 14.9(H)  Hemoglobin 12.0 - 15.0 g/dL 16.112.1 09.613.4 04.514.6  Hematocrit 36.0 - 46.0 % 37.8 40.3 43.2  Platelets 150 - 400 K/uL 245 263 243    7/25: BMP: 137/5.5, 104/22, 35/1.06  7/24: @ 1900: BMP: 132/5.2, 99/22, 28/0.95<131 AST/ALT: 10/11 Uric acid: 9 LDH: 282  @ 0600: BMP: 133/6.7, 10220, 26/0.91 AST/ALT: 11/11 Uric acid: 8.3 LDH: 328  A/P: 33yo [id number] @ 7166w0d admitted for severe preeclampsia 1) FWB reassuring for gestational age, pt on continuous monitoring BMZ course 7/23-7/24 US performed today: 817g (27%) elevated dopplers, but without AEDF or REDF S/p NICU consult  2) Severe preeclampsia -Pt currently on Mag 0.5g/hr, Mag level stable as above -Strict I/Os with daily weights -Labs q 12hr, stable as above, if remain stable will decrease to daily -Labetalol 600mg  tid (per MFM may titrate up to 800mg  q8hr) -currently asymptomatic, pt receiving IV Dilaudid prn headache  3) Low lying placenta- delivery route to be C-section  4) Maternal management Bedrest with bathroom privileges -SCDs for DVT prophylaxis -PNV and Colace daily  DISPO: Plan reviewed with MFM today-  Pt exhibiting signs of worsening preeclampsia  as her Cr continues to elevate.  Will plan for C-section once steroid complete, if she remains asymptomatic with stable BPs, will continue to closely monitor.  Risk benefits and alternatives of cesarean section were discussed with the patient including but not limited to infection, bleeding, damage to bowel , bladder and baby with the need for further surgery. Pt voiced understanding and desires to proceed. NPO, NICU and OR notified of plans for today.  Myna HidalgoJennifer Kortez Murtagh, DO [phone] (pager) [phone] (office)

## 2013-09-24 NOTE — Transfer of Care (Signed)
Immediate Anesthesia Transfer of Care Note  [name]  Procedure(s) Performed: Procedure(s): CESAREAN SECTION (N/A)  Patient Location: PACU  Anesthesia Type:Spinal and Epidural  Level of Consciousness: awake, alert  and oriented  Airway & Oxygen Therapy: Patient Spontanous Breathing  Post-op Assessment: Report given to PACU RN and Post -op Vital signs reviewed and stable  Post vital signs: Reviewed and stable  Complications: No apparent anesthesia complications

## 2013-09-25 LAB — COMPREHENSIVE METABOLIC PANEL
ALT: 14 U/L (ref 0–35)
AST: 18 U/L (ref 0–37)
Albumin: 1.8 g/dL — ABNORMAL LOW (ref 3.5–5.2)
Alkaline Phosphatase: 80 U/L (ref 39–117)
Anion gap: 7 (ref 5–15)
BUN: 35 mg/dL — ABNORMAL HIGH (ref 6–23)
CALCIUM: 7.7 mg/dL — AB (ref 8.4–10.5)
CO2: 26 meq/L (ref 19–32)
Chloride: 105 mEq/L (ref 96–112)
Creatinine, Ser: 1.09 mg/dL (ref 0.50–1.10)
GFR, EST AFRICAN AMERICAN: 76 mL/min — AB (ref >90)
GFR, EST NON AFRICAN AMERICAN: 66 mL/min — AB (ref >90)
GLUCOSE: 88 mg/dL (ref 70–99)
Potassium: 6 mEq/L — ABNORMAL HIGH (ref 3.7–5.3)
Sodium: 138 mEq/L (ref 137–147)
Total Bilirubin: <0.2 mg/dL — ABNORMAL LOW (ref 0.3–1.2)
Total Protein: 5.3 g/dL — ABNORMAL LOW (ref 6.0–8.3)

## 2013-09-25 LAB — MAGNESIUM: Magnesium: 5.8 mg/dL — ABNORMAL HIGH (ref 1.5–2.5)

## 2013-09-25 LAB — CBC
HCT: 34.6 % — ABNORMAL LOW (ref 36.0–46.0)
Hemoglobin: 10.9 g/dL — ABNORMAL LOW (ref 12.0–15.0)
MCH: 27.5 pg (ref 26.0–34.0)
MCHC: 31.5 g/dL (ref 30.0–36.0)
MCV: 87.2 fL (ref 78.0–100.0)
PLATELETS: 221 10*3/uL (ref 150–400)
RBC: 3.97 MIL/uL (ref 3.87–5.11)
RDW: 15.5 % (ref 11.5–15.5)
WBC: 15.2 10*3/uL — ABNORMAL HIGH (ref 4.0–10.5)

## 2013-09-25 LAB — URIC ACID: URIC ACID, SERUM: 9.3 mg/dL — AB (ref 2.4–7.0)

## 2013-09-25 MED ORDER — FENTANYL CITRATE 0.05 MG/ML IJ SOLN
INTRAMUSCULAR | Status: AC
  Filled 2013-09-25: qty 2

## 2013-09-25 MED ORDER — MEASLES, MUMPS & RUBELLA VAC ~~LOC~~ INJ
0.5000 mL | INJECTION | Freq: Once | SUBCUTANEOUS | Status: DC
  Filled 2013-09-25: qty 0.5

## 2013-09-25 MED ORDER — ENOXAPARIN SODIUM 60 MG/0.6ML ~~LOC~~ SOLN
60.0000 mg | SUBCUTANEOUS | Status: DC
  Administered 2013-09-25 – 2013-09-28 (×4): 60 mg via SUBCUTANEOUS
  Filled 2013-09-25 (×4): qty 0.6

## 2013-09-25 MED ORDER — LABETALOL HCL 300 MG PO TABS
600.0000 mg | ORAL_TABLET | Freq: Two times a day (BID) | ORAL | Status: DC
  Administered 2013-09-25: 600 mg via ORAL
  Filled 2013-09-25 (×2): qty 2

## 2013-09-25 NOTE — Progress Notes (Signed)
OB PN:  S: Pt resting comfortably in bed.  Denies pain currently.  Slight headache 3/10, no blurry vision, no RUQ pain.  No F/C/CP/SOB.  Tolerating general diet.  +flatus, no BM.  Foley in place.  Minimal ambulation to bathroom.  O: BP 141/81  Pulse 64  Temp(Src) 98.1 F (36.7 C) (Oral)  Resp 18  Ht 5\' 3"  (1.6 m)  Wt 130.364 kg (287 lb 6.4 oz)  BMI 50.92 kg/m2  SpO2 99%  Breastfeeding? Unknown BP range: 121-187/72-105 (24hrs) UOP: 1105/8hr  Gen: NAD CV: RRR Lungs: CTAB Abd: obese, soft, +BS, no RUQ pain, uterus firm, below umbilicus, appropriately tender Incision: C/D/I with bandage Ext: 1+ pitting edema with 1 beat clonus, DTRs 1+ bilaterally, SCDs in place, no calf tenderness bilaterally  CBC Latest Ref Rng 09/25/2013 09/24/2013 09/24/2013  WBC 4.0 - 10.5 K/uL 15.2(H) 16.7(H) 17.2(H)  Hemoglobin 12.0 - 15.0 g/dL 10.9(L) 11.8(L) 12.1  Hematocrit 36.0 - 46.0 % 34.6(L) 36.6 37.8  Platelets 150 - 400 K/uL 221 234 245   7/26: BMP: 138/6, 105/26, 35/1.09< 88 AST/ALT: 18/14 Uric acid: 9.3 Magnesium: 5.8  7/25: BMP: 137/5.5, 104/22, 35/1.06 Uric acid: 9  7/24: @ 1900: BMP: 132/5.2, 99/22, 28/0.95<131 AST/ALT: 10/11 Uric acid: 9 LDH: 282  @ 0600: BMP: 133/6.7, 10220, 26/0.91 AST/ALT: 11/11 Uric acid: 8.3 LDH: 328  A/P: 33yo [id number] s/p primary C-section for severe preeclampsia and low-lying placenta, POD#1 1) Severe preeclampsia -continue Magnesium 1g/hr until 1800 tonight -Continue strict I/Os, UOP starting to improve -Labs as above, will continue daily labs -Labetalol 600mg  tid.  Goal BP range: 140s/80, will consider decreasing to tid if her BPs drop to 130s -Will plan to remove foley and transfer out of ICU once she is off Magnesium this evening  2) Postoperative care -Pain: Percocet and Motrin prn pain -GU: Foley to be removed s/p magnesium -GI: Tolerating general diet -DVT prophylaxis: SCDs in place, due to multiple risk factors including morbid obesity will start  on Lovenox daily  Myna HidalgoJennifer Biruk Troia, DO [phone] (pager) [phone] (office)

## 2013-09-25 NOTE — Progress Notes (Signed)
09/25/13 1725  Vitals  BP 137/75 mmHg  MAP (mmHg) 91  BP Location Left arm  BP Method Automatic  Patient Position (if appropriate) Lying  Pulse Rate 73  Resp 20  Oxygen Therapy  SpO2 99 %  O2 Device None (Room air)  s/p 600mg  po Labetalol and Percocet for pain

## 2013-09-25 NOTE — Anesthesia Postprocedure Evaluation (Signed)
  Anesthesia Post-op Note  [facility]  Procedure(s) Performed: Procedure(s): CESAREAN SECTION (N/A)  Patient Location: PACU and A-ICU  Anesthesia Type:Spinal and Epidural  Level of Consciousness: awake, alert  and oriented  Airway and Oxygen Therapy: Patient Spontanous Breathing  Post-op Pain: mild  Post-op Assessment: Patient's Cardiovascular Status Stable, Respiratory Function Stable, No signs of Nausea or vomiting, Adequate PO intake and Pain level controlled  Post-op Vital Signs: Reviewed and stable  Last Vitals:  Filed Vitals:   09/25/13 0909  BP: 138/88  Pulse: 65  Temp:   Resp: 22    Complications: No apparent anesthesia complications

## 2013-09-25 NOTE — Progress Notes (Signed)
ANTICOAGULATION CONSULT NOTE - Initial Consult  Pharmacy Consult for Lovenox Indication: VTE prophylaxis  Allergies  Allergen Reactions  . Minocycline Hives and Other (See Comments)    Throat swelling  . Sulfa Antibiotics Hives and Other (See Comments)    Eyes turn red, and can't see     Patient Measurements: Height: 5\' 3"  (160 cm) Weight: 287 lb 6.4 oz (130.364 kg) IBW/kg (Calculated) : 52.4   Vital Signs: Temp: 98.1 F (36.7 C) (07/26 0400) Temp src: Oral (07/26 0400) BP: 141/81 mmHg (07/26 0621) Pulse Rate: 64 (07/26 0621)  Labs:  Recent Labs  09/23/13 1859 09/24/13 0508 09/24/13 1402 09/25/13 0520  HGB 13.4 12.1 11.8* 10.9*  HCT 40.3 37.8 36.6 34.6*  PLT 263 245 234 221  CREATININE 0.95 1.06  --  1.09    Estimated Creatinine Clearance: 96.9 ml/min (by C-G formula based on Cr of 1.09).   Medical History: Past Medical History  Diagnosis Date  . Hypertension   . Anemia     Medications:  Scheduled:  . diphenhydrAMINE      . enoxaparin (LOVENOX) injection  60 mg Subcutaneous Q24H  . ibuprofen  600 mg Oral 4 times per day  . ketorolac      . labetalol  600 mg Oral TID  . labetalol      . prenatal multivitamin  1 tablet Oral Q1200  . scopolamine  1 patch Transdermal Once  . senna-docusate  2 tablet Oral Q24H  . simethicone  80 mg Oral TID PC  . simethicone  80 mg Oral Q24H  . Tdap  0.5 mL Intramuscular Once   Infusions:  . lactated ringers 100 mL/hr at 09/25/13 0035  . magnesium sulfate 1 g/hr (09/24/13 1812)  . naLOXone [facility](NARCAN) adult infusion for PRURITIS    . oxytocin 40 units in LR 1000 mL Stopped (09/25/13 0030)   PRN: dibucaine, diphenhydrAMINE, diphenhydrAMINE, diphenhydrAMINE, diphenhydrAMINE, ketorolac, ketorolac, labetalol, lanolin, menthol-cetylpyridinium, metoCLOPramide (REGLAN) injection, nalbuphine, nalbuphine, naLOXone (NARCAN) adult infusion for PRURITIS, naloxone, ondansetron (ZOFRAN) IV, ondansetron (ZOFRAN) IV, ondansetron,  oxyCODONE-acetaminophen, simethicone, sodium chloride, witch hazel-glycerin, zolpidem  Assessment: 130kg large female S/P C-sect with removal of epidural catheter at approx 1930 last evening.  Goal of Therapy:  Monitor platelets by anticoagulation protocol: Yes . Usual regimen for prophylaxis with CrCl>6430ml/min  is 40mg  q24hr to start 12hr post pulling epidural catheter / C-sect.  However, due to large body mass will use 0.5mg /kg q24hr (rounding to nearest 10mg  increment).   Plan:  1. Lovenox 60mg  q24h to start now. 2.  CBC daily for platelet count  Leoni Goodness, Shon Batoneil E 09/25/2013,7:33 AM

## 2013-09-26 LAB — COMPREHENSIVE METABOLIC PANEL
ALK PHOS: 74 U/L (ref 39–117)
ALT: 14 U/L (ref 0–35)
AST: 15 U/L (ref 0–37)
Albumin: 1.6 g/dL — ABNORMAL LOW (ref 3.5–5.2)
Anion gap: 8 (ref 5–15)
BUN: 24 mg/dL — AB (ref 6–23)
CO2: 25 mEq/L (ref 19–32)
CREATININE: 0.78 mg/dL (ref 0.50–1.10)
Calcium: 7.3 mg/dL — ABNORMAL LOW (ref 8.4–10.5)
Chloride: 105 mEq/L (ref 96–112)
GFR calc Af Amer: >90 mL/min (ref >90)
GFR calc non Af Amer: >90 mL/min (ref >90)
Glucose, Bld: 77 mg/dL (ref 70–99)
POTASSIUM: 4.9 meq/L (ref 3.7–5.3)
Sodium: 138 mEq/L (ref 137–147)
TOTAL PROTEIN: 4.9 g/dL — AB (ref 6.0–8.3)

## 2013-09-26 LAB — CBC
HCT: 30 % — ABNORMAL LOW (ref 36.0–46.0)
Hemoglobin: 9.6 g/dL — ABNORMAL LOW (ref 12.0–15.0)
MCH: 28.1 pg (ref 26.0–34.0)
MCHC: 32 g/dL (ref 30.0–36.0)
MCV: 87.7 fL (ref 78.0–100.0)
PLATELETS: 178 10*3/uL (ref 150–400)
RBC: 3.42 MIL/uL — ABNORMAL LOW (ref 3.87–5.11)
RDW: 15.8 % — AB (ref 11.5–15.5)
WBC: 12.5 10*3/uL — ABNORMAL HIGH (ref 4.0–10.5)

## 2013-09-26 MED ORDER — ZOLPIDEM TARTRATE 5 MG PO TABS
5.0000 mg | ORAL_TABLET | Freq: Every evening | ORAL | Status: DC | PRN
  Administered 2013-09-26: 5 mg via ORAL
  Filled 2013-09-26: qty 1

## 2013-09-26 MED ORDER — LABETALOL HCL 300 MG PO TABS
600.0000 mg | ORAL_TABLET | Freq: Three times a day (TID) | ORAL | Status: DC
  Administered 2013-09-26 – 2013-09-27 (×4): 600 mg via ORAL
  Filled 2013-09-26 (×5): qty 2

## 2013-09-26 NOTE — Progress Notes (Signed)
Bp this AM 162/89 Denies h/a ,blurred vision.RUQ pain.Dr Charlotta Newtonzan called to check on patient and was told about her bp and order given for a now dose of 600 mg of Labetalol and this was given to patient.

## 2013-09-26 NOTE — Progress Notes (Signed)
Ur chart review completed.  

## 2013-09-26 NOTE — Progress Notes (Signed)
[facility].Was just medicated for pain and was very uncomfortable,per patient.Once in bed bp 180/106.Wanted to allow patient to rest a little since she had just walked to room and allow pain medication to work DTR's were WNL,no h/a,blurred vision,RUQ pain.Went to repeat bp patient had been up to bathroom and pumped breasts and was still a little uncomfortable,bp 186/92.Wanted to allow patient to rest but at 0020 decided to give Labetalol 10mg  IV.At 0033 bp came down to 149/81.Patients pain has improved and she is more comfortable.DTR;s WNL.

## 2013-09-26 NOTE — Progress Notes (Addendum)
Subjective: Postpartum Day 2: Cesarean Delivery due to severe pre-eclampsia. Patient up ad lib, reports no syncope or dizziness. Denies H/A, visual changes, RUQ pain, N/V, CP or SOB.  Was asked by RN to come to talk to pt re: her desire to be discharged now. Per pt, she is frustrated with being here. She wants to go home as she has other children to tend to, has back issues from lying in bed and lower extremity swelling. States if the reason she is still here is due to bloodwork, she can come back to the hospital to have it drawn.  Objective: Vital signs in last 24 hours: Temp:  [97.7 F (36.5 C)-98.6 F (37 C)] 97.7 F (36.5 C) (07/27 2112) Pulse Rate:  [61-86] 81 (07/27 2112) Resp:  [18] 18 (07/27 2112) BP: (134-164)/(56-92) 134/56 mmHg (07/27 2112) SpO2:  [98 %-100 %] 99 % (07/27 2112) Weight:  [294 lb (133.358 kg)] 294 lb (133.358 kg) (07/27 0617)  BPs since midnight = 134-164/56-92  Results for orders placed during the hospital encounter of 09/22/13 (from the past 24 hour(s))  CBC     Status: Abnormal   Collection Time    09/26/13  5:20 AM      Result Value Ref Range   WBC 12.5 (*) 4.0 - 10.5 K/uL   RBC 3.42 (*) 3.87 - 5.11 MIL/uL   Hemoglobin 9.6 (*) 12.0 - 15.0 g/dL   HCT 30.0 (*) 36.0 - 46.0 %   MCV 87.7  78.0 - 100.0 fL   MCH 28.1  26.0 - 34.0 pg   MCHC 32.0  30.0 - 36.0 g/dL   RDW 15.8 (*) 11.5 - 15.5 %   Platelets 178  150 - 400 K/uL  COMPREHENSIVE METABOLIC PANEL     Status: Abnormal   Collection Time    09/26/13 10:20 AM      Result Value Ref Range   Sodium 138  137 - 147 mEq/L   Potassium 4.9  3.7 - 5.3 mEq/L   Chloride 105  96 - 112 mEq/L   CO2 25  19 - 32 mEq/L   Glucose, Bld 77  70 - 99 mg/dL   BUN 24 (*) 6 - 23 mg/dL   Creatinine, Ser 0.78  0.50 - 1.10 mg/dL   Calcium 7.3 (*) 8.4 - 10.5 mg/dL   Total Protein 4.9 (*) 6.0 - 8.3 g/dL   Albumin 1.6 (*) 3.5 - 5.2 g/dL   AST 15  0 - 37 U/L   ALT 14  0 - 35 U/L   Alkaline Phosphatase 74  39 - 117 U/L    Total Bilirubin <0.2 (*) 0.3 - 1.2 mg/dL   GFR calc non Af Amer >90  >90 mL/min   GFR calc Af Amer >90  >90 mL/min   Anion gap 8  5 - 15    Medications Report     Scheduled     Medication Dose/Rate, Route, Frequency Last Action     enoxaparin (LOVENOX) injection 60 mg 60 mg, [place], Q24H Given: 07/27 0807     ibuprofen (ADVIL,MOTRIN) tablet 600 mg 600 mg, PO, Q6H Given: 07/27 2348     labetalol (NORMODYNE) tablet 600 mg 600 mg, PO, Q8H Given: 07/27 2226     measles, mumps and rubella vaccine (MMR) injection 0.5 mL 0.5 mL, [place], Once Ordered     prenatal multivitamin tablet 1 tablet 1 tablet, PO, Q1200 Given: 07/27 1219     scopolamine (TRANSDERM-SCOP) 1 MG/3DAYS 1.5 mg 1  patch, TD, Once Ordered     senna-docusate (Senokot-S) tablet 2 tablet 2 tablet, PO, Q24H Given: 07/27 0007     simethicone (MYLICON) chewable tablet 80 mg 80 mg, PO, TID PC Given: 07/27 1220     simethicone (MYLICON) chewable tablet 80 mg 80 mg, PO, Q24H Given: 07/27 2348     Tdap (BOOSTRIX) injection 0.5 mL 0.5 mL, IM, Once Ordered        PRN     Medication Dose/Rate, Route, Frequency Last Action    dibucaine (NUPERCAINAL) 1 % rectal ointment 1 application 1 application, RE, PRN Ordered    diphenhydrAMINE (BENADRYL) capsule 25 mg 25 mg, PO, Q6H PRN Ordered    diphenhydrAMINE (BENADRYL) capsule 25 mg 25 mg, PO, Q4H PRN Given: 07/26 1244    diphenhydrAMINE (BENADRYL) injection 12.5 mg No Dose/Rate, IV, Q4H PRN See Alternative: 07/26 1244    diphenhydrAMINE (BENADRYL) injection 25 mg No Dose/Rate, IM, Q4H PRN See Alternative: 07/26 1244    labetalol (NORMODYNE,TRANDATE) injection 10-20 mg 10 mg, IV, Q10 min PRN Given: 07/27 0009    lanolin ointment 1 application 1 application, TP, PRN Ordered    menthol-cetylpyridinium (CEPACOL) lozenge 3 mg 1 lozenge, PO, Q2H PRN Ordered    metoCLOPramide (REGLAN) injection 10 mg 10 mg, IV, Q8H PRN Ordered    nalbuphine (NUBAIN) injection 5-10 mg 5-10 mg, Darcell Sabino, Q4H PRN Ordered    nalbuphine  (NUBAIN) injection 5-10 mg 5-10 mg, IV, Q4H PRN Ordered    naloxone (NARCAN) injection 0.4 mg 0.4 mg, IV, PRN Ordered    ondansetron (ZOFRAN) injection 4 mg 4 mg, IV, Q4H PRN Ordered    ondansetron (ZOFRAN) injection 4 mg 4 mg, IV, Q8H PRN Ordered    ondansetron (ZOFRAN) tablet 4 mg 4 mg, PO, Q4H PRN Ordered    oxyCODONE-acetaminophen (PERCOCET/ROXICET) 5-325 MG per tablet 1-2 tablet 2 tablet, PO, Q4H PRN Given: 07/27 2019    simethicone (MYLICON) chewable tablet 80 mg 80 mg, PO, PRN Ordered    sodium chloride 0.9 % injection 3 mL 3 mL, IV, PRN Ordered    witch hazel-glycerin (TUCKS) pad 1 application 1 application, TP, PRN Ordered    zolpidem (AMBIEN) tablet 5 mg 5 mg, PO, QHS PRN Given: 07/27 2348      Physical Exam:  General: alert, moderately distressed. Lungs: CTAB. CV: RRR. Lochia: appropriate. Uterine Fundus: firm. Abdomen:  Soft, appropriately tender to palpation. Incision: C/D/I. DVT Evaluation: No evidence of DVT seen on physical exam. Negative Homan's sign. 1+ DTRs bilaterally, 1 beat clonus. Ext: 1+ pedal edema.  Last dose of IV Labetalol = 09/26/13 @ 0009. Only dose of po Labetalol was today at 2226.   Recent Labs  09/24/13 1402 09/25/13 0520 09/26/13 0520  HGB 11.8* 10.9* 9.6*  HCT 36.6 34.6* 30.0*  WBC 16.7* 15.2* 12.5*    Assessment/Plan: Discussed w/ pt rationale for her stay. She fully understands that her BPs have not been controlled, hence IV Labetalol and po Labetalol since delivery. Explained that I could not force her to stay, but if she left it would be AMA. Status post Cesarean section day 2. Postoperative course complicated by elevated BPs.  Labetalol 600 mg tid as ordered; next dose due at 0626 on 09/27/13. After discussion, pt opted to stay but strongly desires d/c tomorrow and will consider leaving AMA if not discharged. Pt agreeable to Ambien 10 mg now. Will update MD.    Farrel Gordon CNM, MS 09/26/2013, 11:42 PM

## 2013-09-26 NOTE — Progress Notes (Signed)
I met pt while rounding on the unit.  Shaun is still processing all that has happened in such a short amount of time.  She is looking at things realistically in that she knows it was necessary.  She is also looking at it optimistically and hoping that all will go well for her daughter.  It is difficult to see her daughter in this state and she is also in shock because she had 3 health babies prior to this pregnancy.    Her children (ages 88, 31 and 2) are coming up to visit and are anxious to meet their sister.    We will continue to provide support as we see the family in the NICU, but please also page as needs arise.  [employer] Pager, [phone] 3:15 PM   09/26/13 1500  Clinical Encounter Type  Visited With Patient  Visit Type Spiritual support  Spiritual Encounters  Spiritual Needs Emotional

## 2013-09-26 NOTE — Lactation Note (Signed)
This note was copied from the chart of Amanda Ryan. Lactation Consultation Note     Follow up consult with this mom of a NICU baby, now 41 hours post partum, and 27 2/7 weeks CGA. The baby is stable on on Si-pap. Mom has pumped about 4 times since the baby was born. I encouraged her to increase her pumping to every 3 hours around the clock. I explained the importance of the first two week,  And how she would make a full milk supply in 14 days, despite the gestation of her baby. I showed mom how to set premie setting, care for her pump parts, and ow to hand express. Mom busy walking around the room during the consult, and blowing her nose - saying the room is too dry. Mom did repeat what I taught her, and seemed to want to provide EBM for her baby. Mom asked about renting  a DEP. I advised her to call her insurance company for a DEP, and that until that arrived, she could do a 2 week rental. Mom is not  being discharged today. She knows to call for questions/concerns.  [facility] [id number]'XToday's Date: 09/26/2013 Reason for consult: Follow-up assessment;NICU baby   Maternal Data    Feeding    LATCH Score/Interventions                      Lactation Tools Discussed/Used Tools: Pump Breast pump type: Double-Electric Breast Pump WIC Program: No Pump Review: Setup, frequency, and cleaning;Milk Storage;Other (comment) (hand expression taught to mom, and part care, and premie setting)   Consult Status Consult Status: Follow-up Date: 09/27/13 Follow-up type: In-patient    Alfred LevinsLee, Amica Harron Anne 09/26/2013, 10:42 AM

## 2013-09-26 NOTE — Progress Notes (Signed)
Subjective: Postpartum Day 2: Cesarean Delivery due to pre eclampsia Patient up ad lib, reports no HA, syncope or dizziness. Feeding:  Breast pumping, infant in NICU Contraceptive plan:  unsure  Objective: Vital signs in last 24 hours: Temp:  [97.5 F (36.4 C)-98.6 F (37 C)] 98.6 F (37 C) (07/27 0617) Pulse Rate:  [57-77] 65 (07/27 0617) Resp:  [16-20] 18 (07/27 0617) BP: (124-186)/(71-106) 162/89 mmHg (07/27 0617) SpO2:  [98 %-100 %] 98 % (07/27 0617) Weight:  [291 lb (131.997 kg)-294 lb (133.358 kg)] 294 lb (133.358 kg) (07/27 0617) Filed Vitals:   09/25/13 2334 09/26/13 0033 09/26/13 0205 09/26/13 0617  BP: 186/92 149/81 143/79 162/89  Pulse: 61 61 65 65  Temp:   97.9 F (36.6 C) 98.6 F (37 C)  TempSrc:   Oral Oral  Resp: 20 18 18 18   Height:      Weight:    294 lb (133.358 kg)  SpO2: 100% 100% 100% 98%    Physical Exam:  General: alert and cooperative Lochia: appropriate Uterine Fundus: firm Abdomen:  + bowel sounds, non distended Incision: healing well  Honeycomb dressing CDI DVT Evaluation: No evidence of DVT seen on physical exam. Homan's sign: Negative JP drain:   n/a   Recent Labs  09/24/13 1402 09/25/13 0520 09/26/13 0520  HGB 11.8* 10.9* 9.6*  HCT 36.6 34.6* 30.0*  WBC 16.7* 15.2* 12.5*    Assessment/Plan: Status post Cesarean section day 2 Doing well postoperatively.  Continue current care. Plan for discharge tomorrow Anemia - hemodynamicly stable.   Labs: CMP      Mayeli Bornhorst, CNM, MSN 09/26/2013. 9:40 AM

## 2013-09-27 LAB — CBC WITH DIFFERENTIAL/PLATELET
BASOS ABS: 0 10*3/uL (ref 0.0–0.1)
Basophils Relative: 0 % (ref 0–1)
EOS ABS: 0.3 10*3/uL (ref 0.0–0.7)
Eosinophils Relative: 2 % (ref 0–5)
HCT: 30.1 % — ABNORMAL LOW (ref 36.0–46.0)
Hemoglobin: 9.4 g/dL — ABNORMAL LOW (ref 12.0–15.0)
LYMPHS ABS: 2.6 10*3/uL (ref 0.7–4.0)
Lymphocytes Relative: 20 % (ref 12–46)
MCH: 27.6 pg (ref 26.0–34.0)
MCHC: 31.2 g/dL (ref 30.0–36.0)
MCV: 88.5 fL (ref 78.0–100.0)
Monocytes Absolute: 0.8 10*3/uL (ref 0.1–1.0)
Monocytes Relative: 6 % (ref 3–12)
NEUTROS PCT: 71 % (ref 43–77)
Neutro Abs: 9.1 10*3/uL — ABNORMAL HIGH (ref 1.7–7.7)
PLATELETS: 158 10*3/uL (ref 150–400)
RBC: 3.4 MIL/uL — ABNORMAL LOW (ref 3.87–5.11)
RDW: 16.2 % — AB (ref 11.5–15.5)
WBC: 12.7 10*3/uL — ABNORMAL HIGH (ref 4.0–10.5)

## 2013-09-27 LAB — COMPREHENSIVE METABOLIC PANEL
ALT: 13 U/L (ref 0–35)
AST: 13 U/L (ref 0–37)
Albumin: 1.6 g/dL — ABNORMAL LOW (ref 3.5–5.2)
Alkaline Phosphatase: 81 U/L (ref 39–117)
Anion gap: 8 (ref 5–15)
BUN: 19 mg/dL (ref 6–23)
CALCIUM: 7.6 mg/dL — AB (ref 8.4–10.5)
CHLORIDE: 108 meq/L (ref 96–112)
CO2: 23 meq/L (ref 19–32)
Creatinine, Ser: 0.79 mg/dL (ref 0.50–1.10)
GLUCOSE: 95 mg/dL (ref 70–99)
Potassium: 5 mEq/L (ref 3.7–5.3)
SODIUM: 139 meq/L (ref 137–147)
Total Bilirubin: <0.2 mg/dL — ABNORMAL LOW (ref 0.3–1.2)
Total Protein: 4.8 g/dL — ABNORMAL LOW (ref 6.0–8.3)

## 2013-09-27 LAB — RPR

## 2013-09-27 MED ORDER — NIFEDIPINE ER OSMOTIC RELEASE 90 MG PO TB24
90.0000 mg | ORAL_TABLET | Freq: Every day | ORAL | Status: DC
  Administered 2013-09-28: 90 mg via ORAL
  Filled 2013-09-27: qty 1

## 2013-09-27 MED ORDER — LABETALOL HCL 300 MG PO TABS
600.0000 mg | ORAL_TABLET | Freq: Four times a day (QID) | ORAL | Status: DC
  Administered 2013-09-27 – 2013-09-28 (×4): 600 mg via ORAL
  Filled 2013-09-27 (×5): qty 2

## 2013-09-27 MED ORDER — NIFEDIPINE ER 60 MG PO TB24
60.0000 mg | ORAL_TABLET | Freq: Once | ORAL | Status: AC
  Administered 2013-09-27: 60 mg via ORAL
  Filled 2013-09-27: qty 1

## 2013-09-27 NOTE — Progress Notes (Addendum)
Subjective: Postpartum Day 3: Cesarean Delivery due to severe pre eclampsia Patient up ad lib, reports no HA, syncope or dizziness.  Pt desires to go home.  States :I'm willing to take what ever medication you give me but  Have other children that I need to care for and I want to go home.  If I don't get discharge by 2pm I'm leaving".     Objective: Vital signs in last 24 hours: Temp:  [97.7 F (36.5 C)-98.7 F (37.1 C)] 97.9 F (36.6 C) (07/28 1006) Pulse Rate:  [81-92] 84 (07/28 1006) Resp:  [16-18] 18 (07/28 1006) BP: (134-184)/(56-106) 148/90 mmHg (07/28 1006) SpO2:  [98 %-100 %] 100 % (07/28 1006) Weight:  [298 lb 1.9 oz (135.226 kg)] 298 lb 1.9 oz (135.226 kg) (07/28 0630)   Filed Vitals:   09/27/13 0228 09/27/13 0534 09/27/13 0630 09/27/13 1006  BP: 142/84 184/106 154/92 148/90  Pulse: 92 84  84  Temp: 98.7 F (37.1 C) 98.7 F (37.1 C)  97.9 F (36.6 C)  TempSrc: Oral Oral  Oral  Resp: Height:      Weight:   298 lb 1.9 oz (135.226 kg)   SpO2: 98% 100%  100%    Physical Exam:  General: anxious Lochia: appropriate Uterine Fundus: firm Abdomen:  + bowel sounds, non distended Incision: healing well, no significant drainage  DVT Evaluation: No evidence of DVT seen on physical exam. Homan's sign: Negative Pt asymptomatic   Recent Labs  09/25/13 0520 09/26/13 0520 09/27/13 0528  HGB 10.9* 9.6* 9.4*  HCT 34.6* 30.0* 30.1*  WBC 15.2* 12.5* 12.7*    The pt and her husband were given the new plan of care, pt was not in agreement with this plan but the husband acknowledged the severity of her condition and agreed to the plan .  She and her husband were given the risk of leaving against medical advise including but not limited to seizure, stroke and/or the possibility death.  The husband agrees and said "If you think she should stay, she will stay until  you say she's good to go home".   Assessment/Plan: Status post Cesarean section day 3. Doing well  postoperatively.  Continue current care. Anemia - hemodynamicly stable Asymptomatic HTN Consult with Dr Normand Sloop  Procardia 60xl PO QD starting now. BP's q2hrs    [employer], CNM, MSN 09/27/2013. 10:59 AM

## 2013-09-27 NOTE — Lactation Note (Signed)
This note was copied from the chart [name] Lactation Consultation Note     Follow up consult with this mom of a NICU baby, now 5968 hours old, and 27 3/7 weeks CGA. The baby was reintubated yesterday, for increased WOB, and is being treated for a PDA. Mom is being treated with multiple meds for hypertension, and does not have a discharged date at this time.  Mom will need to loan a DEP fr 2 weeks, at time of discharge. She will also need a pump prescription from her OB.   [facility]: [name] ZOXWR'UToday's Date: 09/27/2013 Reason for consult: Follow-up assessment   Maternal Data    Feeding Feeding Type: Other (comment) (NPO)  LATCH Score/Interventions                      Lactation Tools Discussed/Used     Consult Status Consult Status: Follow-up Date: 09/28/13 Follow-up type: In-patient    Amanda Ryan, Amanda Ryan Anne 09/27/2013, 2:01 PM

## 2013-09-27 NOTE — Progress Notes (Signed)
Clinical Social Work Department PSYCHOSOCIAL ASSESSMENT - MATERNAL/CHILD 09/27/2013  Patient:  Amanda Ryan,Amanda Ryan  Account Number:  [id number]  Admit Date:  09/22/2013  Childs Name:   Amel    Clinical Social Worker:  Aydrien Froman, LCSW   Date/Time:  09/27/2013 02:30 PM  Date Referred:        Other referral source:   No referral-NICU admission    I:  FAMILY / HOME ENVIRONMENT Child's legal guardian:  PARENT  Guardian - Name Guardian - Age Guardian - Address  Amanda Ryan 33 [address] [place], [place] [address]  Amanda Ryan  same   Other household support members/support persons Name Relationship DOB   DAUGHTER 2000   SON 2002   DAUGHTER 2013   Other support:   MOB's sister is currently visiting here from [place] for the week.    II  PSYCHOSOCIAL DATA Information Source:  Family Interview  Financial and Community Resources Employment:   FOB owns a cell phone store   Financial resources:  Private Insurance If Medicaid - County:  [place]  School / Grade:   Maternity Care Coordinator / Child Services Coordination / Early Interventions:  Cultural issues impacting care:   Family is originally from [place].  Family speaks English fluently.    III  STRENGTHS Strengths  Adequate Resources  Home prepared for Child (including basic supplies)  Other - See comment   Strength comment:  Pediatric follow up will be with Dr. Tucker at [facility].   IV  RISK FACTORS AND CURRENT PROBLEMS Current Problem:  None   Risk Factor & Current Problem Patient Issue Family Issue Risk Factor / Current Problem Comment   N N     V  SOCIAL WORK ASSESSMENT CSW met with MOB in her third floor room/317 to introduce myself, offer support and complete assessment due to admission to NICU at 27 weeks.  MOB's sister and 3 children were visiting so CSW offered to return at a later time. MOB replied, "it depends on how long this is going to take."  CSW explained CSW's role  and reason for wanting to talk with MOB.  She states CSW could stay at this time. MOB's sister offered to take the children (ages 15, 13 and 2) to the waiting area, but MOB declined.  When asked how MOB is feeling physically, she states, "I'm fine, but they say I'm not fine."  MOB states she would like to go home so she can get some rest (c/o uncomfortable bed in the hospital and extended hospitalization), but states she has been told she is not ready for discharge.  CSW validated her feelings of frustration over things not going as planned but reiterrated the importance of discharging when medically ready in order to reduce the possibility for a readmission/longer hospitalization or decline in health. MOB acted as if she has heard this before and was not interested in discussing it.  CSW asked about the baby and MOB states the baby is doing ok at this time.  She states she had no warning that she would be born prematurely and did not know until the day the baby was born.  CSW acknowledged the trauma of the situation and attempted to engage MOB in a conversation about her emotions surrounding the situation, but she was very difficult to engage at this time.  She reports she and her husband are being updated adequately and that they have no questions at this time. CSW encouraged them to call CSW if they would   like to have a family meeting at any time and to not be alarmed if staff calls them to arrange a meeting.  MOB states she has some supplies still from her 79 year old daughter at home.  She understands that baby will most likely need a preemie car seat, which she will look into at a later time.  The family does not have family support in [place].  They have lived here approximately a year and state they moved here from [place] for FOB's job.  When asked how they like it, 77 year old daughter commented that it's "too hot." MOB's family is in [place] and FOB's family is in [place]. MOB states no questions,  concerns or needs at this time. CSW briefly discussed PPD signs and symptoms and gave MOB CSW's contact information.      VI SOCIAL WORK PLAN Social Work Therapist, art  Psychosocial Support/Ongoing Assessment of Needs   Type of pt/family education:   PPD signs and symptoms  Ongoing support services offered by NICU CSW   If child protective services report - county:   If child protective services report - date:   Information/referral to community resources comment:   Informed MOB of baby's eligibility for SSI and how to apply if interested.   Other social work plan:

## 2013-09-28 ENCOUNTER — Encounter (HOSPITAL_COMMUNITY): Payer: Self-pay | Admitting: Obstetrics & Gynecology

## 2013-09-28 LAB — TYPE AND SCREEN
ABO/RH(D): O POS
ANTIBODY SCREEN: NEGATIVE
Unit division: 0
Unit division: 0

## 2013-09-28 LAB — CBC
HEMATOCRIT: 32.8 % — AB (ref 36.0–46.0)
Hemoglobin: 10.3 g/dL — ABNORMAL LOW (ref 12.0–15.0)
MCH: 27.7 pg (ref 26.0–34.0)
MCHC: 31.4 g/dL (ref 30.0–36.0)
MCV: 88.2 fL (ref 78.0–100.0)
PLATELETS: 219 10*3/uL (ref 150–400)
RBC: 3.72 MIL/uL — ABNORMAL LOW (ref 3.87–5.11)
RDW: 16.1 % — AB (ref 11.5–15.5)
WBC: 13.2 10*3/uL — AB (ref 4.0–10.5)

## 2013-09-28 MED ORDER — IBUPROFEN 600 MG PO TABS: 600.0000 mg | ORAL_TABLET | Freq: Four times a day (QID) | ORAL | Status: AC

## 2013-09-28 MED ORDER — OXYCODONE-ACETAMINOPHEN 5-325 MG PO TABS: 1.0000 | ORAL_TABLET | ORAL | Status: AC | PRN

## 2013-09-28 MED ORDER — FERROUS SULFATE 325 (65 FE) MG PO TABS: 325.0000 mg | ORAL_TABLET | Freq: Every day | ORAL | Status: AC

## 2013-09-28 MED ORDER — NIFEDIPINE ER 30 MG PO TB24
30.0000 mg | ORAL_TABLET | Freq: Once | ORAL | Status: AC
  Administered 2013-09-28: 30 mg via ORAL
  Filled 2013-09-28: qty 1

## 2013-09-28 MED ORDER — NIFEDIPINE ER OSMOTIC RELEASE 90 MG PO TB24: 90.0000 mg | ORAL_TABLET | Freq: Every day | ORAL | Status: AC

## 2013-09-28 NOTE — Progress Notes (Signed)
Subjective: Postpartum Day 3: Cesarean Delivery due to severe preeclampsia  Was contacted by RN at approx 2250 re: pt seeing spots and having moderate headache, 5/10. BP 173/94.  Pain well controlled w/ Percocet.   Objective: Vital signs in last 24 hours: Temp:  [97.7 F (36.5 C)-99.2 F (37.3 C)] 98.4 F (36.9 C) (07/29 0152) Pulse Rate:  [71-93] 71 (07/29 0152) Resp:  [18-20] 18 (07/29 0152) BP: (127-185)/(74-106) 127/74 mmHg (07/29 0152) SpO2:  [98 %-100 %] 98 % (07/29 0152) Weight:  [298 lb 1.9 oz (135.226 kg)] 298 lb 1.9 oz (135.226 kg) (07/28 0630)  Physical Exam:  General: alert and cooperative, anxious, but laughing w/ spouse Lungs: CTAB CV: RRR Lochia: appropriate Uterine Fundus: firm Abdomen:  Soft, NTND Incision: healing well DVT Evaluation: No evidence of DVT seen on physical exam. Negative Homan's sign. 1+ DTRs, no clonus, 3+ pitting edema  On exam, pt reported bilateral facial numbness, with ongoing numbness to both arms - "well before delivery." On exam, no drooping to either side of face, speech clear and muscle strength good. Acknowledges light touch to face.   Recent Labs  09/25/13 0520 09/26/13 0520 09/27/13 0528  HGB 10.9* 9.6* 9.4*  HCT 34.6* 30.0* 30.1*  WBC 15.2* 12.5* 12.7*    Assessment: Status post Cesarean section day 3. Postoperative course complicated by elevated BPs, now w/ visual disturbances and headache, and resolved w/ IV Labetolol. BP now 154/93. Anemia.  Plan: Follow IV Labetolol parameters. Procardia 30 mg XL po x 1 now. Rest. Monitor closely. Reviewed side effect headache w/ Procardia use. Dr. Su Hiltoberts updated.     Sherre ScarletWILLIAMS, Amanda Kendle CNM, MS 09/27/13 @ 10:56 PM

## 2013-09-28 NOTE — Discharge Summary (Signed)
Cesarean Section Delivery Discharge Summary  [facility]  DOB:    Dec 12, 1980 MRN:    [id number] CSN:    [id number]  Date of admission:                  09/22/13  Date of discharge:                   09/28/13  Procedures this admission: C/s for severe Pre-E  Date of Delivery: 09/24/13 by Dr Charlotta Newtonzan  Newborn Data:  Live born female  Birth Weight: 1 lb 12.2 oz (800 g) APGAR: 5, 8  Baby to remain in NICU. Circumcision Plan: n/a  History of Present Illness:  Ms. Amanda Ryan is a 33 y.o. female, [phone], who presents at 5973w0d weeks gestation. The patient [facility].    Her pregnancy has been complicated by:  Patient Active Problem List   Diagnosis Date Noted  . Preeclampsia 09/22/2013     Hospital course:  The patient was admitted for observation on antenatal unit for Pre-E, became severe and pt had a c/s.   Her postpartum course was complicated, by elevated BPs, PIH sx, asymptomatic anemia  She was discharged to home on postpartum day 3 doing well.  Feeding:  Pumping for baby in NICU  Contraception:  oral contraceptives (estrogen/progesterone) at 6 weeks PP  Discharge hemoglobin:  Hemoglobin  Date Value Ref Range Status  09/28/2013 10.3* 12.0 - 15.0 g/dL Final     HCT  Date Value Ref Range Status  09/28/2013 32.8* 36.0 - 46.0 % Final    Discharge Physical Exam:   General: alert, cooperative and no distress Lochia: appropriate Uterine Fundus: firm Incision: healing well DVT Evaluation: No evidence of DVT seen on physical exam. Negative Homan's sign.  Intrapartum Procedures: cesarean: low cervical, transverse Postpartum Procedures: 24 hour MgSO4 post c/s, Lovenox for DVT prophylaxis Complications-Operative and Postpartum: none  Discharge Diagnoses: Preelampsia and Preterm delivery, Anemia  Discharge Information:  Activity:           pelvic  rest Diet:                routine Medications: PNV, Ibuprofen, Iron, Percocet and Procardia Condition:      stable Instructions:  Care After Cesarean Delivery  Refer to this sheet in the next few weeks. These instructions provide you with information on caring for yourself after your procedure. Your caregiver may also give you specific instructions. Your treatment has been planned according to current medical practices, but problems sometimes occur. Call your caregiver if you have any problems or questions after you go home. HOME CARE INSTRUCTIONS  Only take over-the-counter or prescription medicines as directed by your caregiver.  Do not drink alcohol, especially if you are breastfeeding or taking medicine to relieve pain.  Do not chew or smoke tobacco.  Continue to use good perineal care. Good perineal care includes:  Wiping your perineum from front to back.  Keeping your perineum clean.  Check your cut (incision) daily for increased redness, drainage, swelling, or separation of skin.  Clean your incision gently with soap and water every day, and then pat it dry. If your caregiver says it is okay, leave the incision uncovered. Use a bandage (dressing) if the incision is draining fluid or appears irritated. If the adhesive strips across the incision do not fall off within 7 days, carefully peel them off.  Hug a pillow  when coughing or sneezing until your incision is healed. This helps to relieve pain.  Do not use tampons or douche until your caregiver says it is okay.  Shower, wash your hair, and take tub baths as directed by your caregiver.  Wear a well-fitting bra that provides breast support.  Limit wearing support panties or control-top hose.  Drink enough fluids to keep your urine clear or pale yellow.  Eat high-fiber foods such as whole grain cereals and breads, brown rice, beans, and fresh fruits and vegetables every day. These foods may help prevent or relieve  constipation.  Resume activities such as climbing stairs, driving, lifting, exercising, or traveling as directed by your caregiver.  Talk to your caregiver about resuming sexual activities. This is dependent upon your risk of infection, your rate of healing, and your comfort and desire to resume sexual activity.  Try to have someone help you with your household activities and your newborn for at least a few days after you leave the hospital.  Rest as much as possible. Try to rest or take a nap when your newborn is sleeping.  Increase your activities gradually.  Keep all of your scheduled postpartum appointments. It is very important to keep your scheduled follow-up appointments. At these appointments, your caregiver will be checking to make sure that you are healing physically and emotionally. SEEK MEDICAL CARE IF:   You are passing large clots from your vagina. Save any clots to show your caregiver.  You have a foul smelling discharge from your vagina.  You have trouble urinating.  You are urinating frequently.  You have pain when you urinate.  You have a change in your bowel movements.  You have increasing redness, pain, or swelling near your incision.  You have pus draining from your incision.  Your incision is separating.  You have painful, hard, or reddened breasts.  You have a severe headache.  You have blurred vision or see spots.  You feel sad or depressed.  You have thoughts of hurting yourself or your newborn.  You have questions about your care, the care of your newborn, or medicines.  You are dizzy or lightheaded.  You have a rash.  You have pain, redness, or swelling at the site of the removed intravenous access (IV) tube.  You have nausea or vomiting.  You stopped breastfeeding and have not had a menstrual period within 12 weeks of stopping.  You are not breastfeeding and have not had a menstrual period within 12 weeks of delivery.  You have a  fever. SEEK IMMEDIATE MEDICAL CARE IF:  You have persistent pain.  You have chest pain.  You have shortness of breath.  You faint.  You have leg pain.  You have stomach pain.  Your vaginal bleeding saturates 2 or more sanitary pads in 1 hour. MAKE SURE YOU:   Understand these instructions.  Will watch your condition.  Will get help right away if you are not doing well or get worse. Document Released: 11/09/2001 Document Revised: 11/12/2011 Document Reviewed: 10/15/2011 [facility] Patient Information 2014 [place], [place].   Postpartum Depression and Baby Blues  The postpartum period begins right after the birth of a baby. During this time, there is often a great amount of joy and excitement. It is also a time of considerable changes in the life of the parent(s). Regardless of how many times a mother gives birth, each child brings new challenges and dynamics to the family. It is not unusual to have feelings  of excitement accompanied by confusing shifts in moods, emotions, and thoughts. All mothers are at risk of developing postpartum depression or the "baby blues." These mood changes can occur right after giving birth, or they may occur many months after giving birth. The baby blues or postpartum depression can be mild or severe. Additionally, postpartum depression can resolve rather quickly, or it can be a long-term condition. CAUSES Elevated hormones and their rapid decline are thought to be a main cause of postpartum depression and the baby blues. There are a number of hormones that radically change during and after pregnancy. Estrogen and progesterone usually decrease immediately after delivering your baby. The level of thyroid hormone and various cortisol steroids also rapidly drop. Other factors that play a major role in these changes include major life events and genetics.  RISK FACTORS If you have any of the following risks for the baby blues or postpartum depression, know what  symptoms to watch out for during the postpartum period. Risk factors that may increase the likelihood of getting the baby blues or postpartum depression include:  Havinga personal or family history of depression.  Having depression while being pregnant.  Having premenstrual or oral contraceptive-associated mood issues.  Having exceptional life stress.  Having marital conflict.  Lacking a social support network.  Having a baby with special needs.  Having health problems such as diabetes. SYMPTOMS Baby blues symptoms include:  Brief fluctuations in mood, such as going from extreme happiness to sadness.  Decreased concentration.  Difficulty sleeping.  Crying spells, tearfulness.  Irritability.  Anxiety. Postpartum depression symptoms typically begin within the first month after giving birth. These symptoms include:  Difficulty sleeping or excessive sleepiness.  Marked weight loss.  Agitation.  Feelings of worthlessness.  Lack of interest in activity or food. Postpartum psychosis is a very concerning condition and can be dangerous. Fortunately, it is rare. Displaying any of the following symptoms is cause for immediate medical attention. Postpartum psychosis symptoms include:  Hallucinations and delusions.  Bizarre or disorganized behavior.  Confusion or disorientation. DIAGNOSIS  A diagnosis is made by an evaluation of your symptoms. There are no medical or lab tests that lead to a diagnosis, but there are various questionnaires that a caregiver may use to identify those with the baby blues, postpartum depression, or psychosis. Often times, a screening tool called the [place] Postnatal Depression Scale is used to diagnose depression in the postpartum period.  TREATMENT The baby blues usually goes away on its own in 1 to 2 weeks. Social support is often all that is needed. You should be encouraged to get adequate sleep and rest. Occasionally, you may be given  medicines to help you sleep.  Postpartum depression requires treatment as it can last several months or longer if it is not treated. Treatment may include individual or group therapy, medicine, or both to address any social, physiological, and psychological factors that may play a role in the depression. Regular exercise, a healthy diet, rest, and social support may also be strongly recommended.  Postpartum psychosis is more serious and needs treatment right away. Hospitalization is often needed. HOME CARE INSTRUCTIONS  Get as much rest as you can. Nap when the baby sleeps.  Exercise regularly. Some women find yoga and walking to be beneficial.  Eat a balanced and nourishing diet.  Do little things that you enjoy. Have a cup of tea, take a bubble bath, read your favorite magazine, or listen to your favorite music.  Avoid alcohol.  Ask  for help with household chores, cooking, grocery shopping, or running errands as needed. Do not try to do everything.  Talk to people close to you about how you are feeling. Get support from your partner, family members, friends, or other new moms.  Try to stay positive in how you think. Think about the things you are grateful for.  Do not spend a lot of time alone.  Only take medicine as directed by your caregiver.  Keep all your postpartum appointments.  Let your caregiver know if you have any concerns. SEEK MEDICAL CARE IF: You are having a reaction or problems with your medicine. SEEK IMMEDIATE MEDICAL CARE IF:  You have suicidal feelings.  You feel you may harm the baby or someone else. Document Released: 11/22/2003 Document Revised: 05/12/2011 Document Reviewed: 12/24/2010 [facility] Patient Information 2014 [place], [place].  Discharge to: home  Follow-up Information   Follow up with [facility]. Schedule an appointment as soon as possible for a visit in 2 days. (for a blood pressure check)    Specialty:   Obstetrics and Gynecology   Contact information:   [address] [place] [address] [phone]      Follow up with [facility]. Schedule an appointment as soon as possible for a visit in 5 weeks. (Call with any questions or concerns)    Specialty:  Obstetrics and Gynecology   Contact information:   [address] [place] [address] [phone]       Haroldine Laws 09/28/2013

## 2013-09-28 NOTE — Progress Notes (Signed)
Subjective: Postpartum Day 3: Cesarean Delivery due to severe Pre-E  Patient up ad lib, reports no syncope or dizziness.  Denies visual changes or RUQ pain.  Pt reports a HA, just received Percocet, will re-evaluate HA.  Pt is most concerned with leg and arm swelling and asking for a diuretic.  Pt would like to go home today.  Feeding:  pumping Contraceptive plan:  unknown  Objective: Vital signs in last 24 hours: Temp:  [97.7 F (36.5 C)-99.2 F (37.3 C)] 98.2 F (36.8 C) (07/29 0516) Pulse Rate:  [71-93] 73 (07/29 0916) Resp:  [18-20] 18 (07/29 0916) BP: (117-185)/(66-103) 117/66 mmHg (07/29 0916) SpO2:  [98 %-100 %] 100 % (07/29 0916)  Physical Exam:  General: alert, cooperative and no distress Lochia: appropriate Uterine Fundus: firm Abdomen:  + bowel sounds Incision: healing well DVT Evaluation: Calf/Ankle/leg edema is present +3 JP drain:   n/a  Filed Vitals:   09/28/13 0002 09/28/13 0152 09/28/13 0516 09/28/13 0916  BP: 130/75 127/74 126/83 117/66  Pulse: 87 71 77 73  Temp: 98 F (36.7 C) 98.4 F (36.9 C) 98.2 F (36.8 C)   TempSrc: Oral Oral Oral   Resp: 20 18 18 18   Height:      Weight:      SpO2: 99% 98% 99% 100%     Recent Labs  09/26/13 0520 09/27/13 0528 09/28/13 0527  HGB 9.6* 9.4* 10.3*  HCT 30.0* 30.1* 32.8*  WBC 12.5* 12.7* 13.2*    Assessment/Plan: Status post Cesarean section day 3. Postoperative course complicated by BPs, edema, anemia  Currently on Labetalol 600mg  QID and Procardia XL 90 mg QD To be followed by MD    Haroldine LawsXLEY, Fidelis Loth CNM, MSN 09/28/2013, 9:41 AM

## 2013-09-28 NOTE — Progress Notes (Signed)
Pt irritated and frustrated this AM,  C/o increased edema in hands and feet, making it painful to ambulate.  She states she believes the Medicine is causing this.   I called Haroldine LawsJennifer Oxley CNM to review,  Per Pt request.  She came to unit within 20 minutes to see pt.  Pt feeling much better p talking with her, and happy to be going home.  Reviewed D/C instructions at 1230 and pt verbalizes understanding, she will attend the F/U appointment Fri 09/30/13 at 1000 for b/p check.

## 2013-09-28 NOTE — Discharge Instructions (Signed)
Postpartum Care After Cesarean Delivery After you deliver your newborn (postpartum period), the usual stay in the hospital is 24-72 hours. If there were problems with your labor or delivery, or if you have other medical problems, you might be in the hospital longer.  While you are in the hospital, you will receive help and instructions on how to care for yourself and your newborn during the postpartum period.  While you are in the hospital:  It is normal for you to have pain or discomfort from the incision in your abdomen. Be sure to tell your nurses when you are having pain, where the pain is located, and what makes the pain worse.  If you are breastfeeding, you may feel uncomfortable contractions of your uterus for a couple of weeks. This is normal. The contractions help your uterus get back to normal size.  It is normal to have some bleeding after delivery.  For the first 1-3 days after delivery, the flow is red and the amount may be similar to a period.  It is common for the flow to start and stop.  In the first few days, you may pass some small clots. Let your nurses know if you begin to pass large clots or your flow increases.  Do not  flush blood clots down the toilet before having the nurse look at them.  During the next 3-10 days after delivery, your flow should become more watery and pink or brown-tinged in color.  Ten to fourteen days after delivery, your flow should be a small amount of yellowish-white discharge.  The amount of your flow will decrease over the first few weeks after delivery. Your flow may stop in 6-8 weeks. Most women have had their flow stop by 12 weeks after delivery.  You should change your sanitary pads frequently.  Wash your hands thoroughly with soap and water for at least 20 seconds after changing pads, using the toilet, or before holding or feeding your newborn.  Your intravenous (IV) tubing will be removed when you are drinking enough fluids.  The  urine drainage tube (urinary catheter) that was inserted before delivery may be removed within 6-8 hours after delivery or when feeling returns to your legs. You should feel like you need to empty your bladder within the first 6-8 hours after the catheter has been removed.  In case you become weak, lightheaded, or faint, call your nurse before you get out of bed for the first time and before you take a shower for the first time.  Within the first few days after delivery, your breasts may begin to feel tender and full. This is called engorgement. Breast tenderness usually goes away within 48-72 hours after engorgement occurs. You may also notice milk leaking from your breasts. If you are not breastfeeding, do not stimulate your breasts. Breast stimulation can make your breasts produce more milk.  Spending as much time as possible with your newborn is very important. During this time, you and your newborn can feel close and get to know each other. Having your newborn stay in your room (rooming in) will help to strengthen the bond with your newborn. It will give you time to get to know your newborn and become comfortable caring for your newborn.  Your hormones change after delivery. Sometimes the hormone changes can temporarily cause you to feel sad or tearful. These feelings should not last more than a few days. If these feelings last longer than that, you should talk to your  caregiver. °· If desired, talk to your caregiver about methods of family planning or contraception. °· Talk to your caregiver about immunizations. Your caregiver may want you to have the following immunizations before leaving the hospital: °· Tetanus, diphtheria, and pertussis (Tdap) or tetanus and diphtheria (Td) immunization. It is very important that you and your family (including grandparents) or others caring for your newborn are up-to-date with the Tdap or Td immunizations. The Tdap or Td immunization can help protect your newborn  from getting ill. °· Rubella immunization. °· Varicella (chickenpox) immunization. °· Influenza immunization. You should receive this annual immunization if you did not receive the immunization during your pregnancy. °Document Released: 11/12/2011 Document Reviewed: 11/12/2011 °[employer]® Patient Information ©2015 [employer]. This information is not intended to replace advice given to you by your health care provider. Make sure you discuss any questions you have with your health care provider. ° °Postpartum Depression and Baby Blues °The postpartum period begins right after the birth of a baby. During this time, there is often a great amount of joy and excitement. It is also a time of many changes in the life of the parents. Regardless of how many times a mother gives birth, each child brings new challenges and dynamics to the family. It is not unusual to have feelings of excitement along with confusing shifts in moods, emotions, and thoughts. All mothers are at risk of developing postpartum depression or the "baby blues." These mood changes can occur right after giving birth, or they may occur many months after giving birth. The baby blues or postpartum depression can be mild or severe. Additionally, postpartum depression can go away rather quickly, or it can be a long-term condition.  °CAUSES °Raised hormone levels and the rapid drop in those levels are thought to be a main cause of postpartum depression and the baby blues. A number of hormones change during and after pregnancy. Estrogen and progesterone usually decrease right after the delivery of your baby. The levels of thyroid hormone and various cortisol steroids also rapidly drop. Other factors that play a role in these mood changes include major life events and genetics.  °RISK FACTORS °If you have any of the following risks for the baby blues or postpartum depression, know what symptoms to watch out for during the postpartum period. Risk factors that may  increase the likelihood of getting the baby blues or postpartum depression include: °· Having a personal or family history of depression.   °· Having depression while being pregnant.   °· Having premenstrual mood issues or mood issues related to oral contraceptives. °· Having a lot of life stress.   °· Having marital conflict.   °· Lacking a social support network.   °· Having a baby with special needs.   °· Having health problems, such as diabetes.   °SIGNS AND SYMPTOMS °Symptoms of baby blues include: °· Brief changes in mood, such as going from extreme happiness to sadness. °· Decreased concentration.   °· Difficulty sleeping.   °· Crying spells, tearfulness.   °· Irritability.   °· Anxiety.   °Symptoms of postpartum depression typically begin within the first month after giving birth. These symptoms include: °· Difficulty sleeping or excessive sleepiness.   °· Marked weight loss.   °· Agitation.   °· Feelings of worthlessness.   °· Lack of interest in activity or food.   °Postpartum psychosis is a very serious condition and can be dangerous. Fortunately, it is rare. Displaying any of the following symptoms is cause for immediate medical attention. Symptoms of postpartum psychosis include:  °· Hallucinations and delusions.   °·   Bizarre or disorganized behavior.   Confusion or disorientation.  DIAGNOSIS  A diagnosis is made by an evaluation of your symptoms. There are no medical or lab tests that lead to a diagnosis, but there are various questionnaires that a health care provider may use to identify those with the baby blues, postpartum depression, or psychosis. Often, a screening tool called the [place] Postnatal Depression Scale is used to diagnose depression in the postpartum period.  TREATMENT The baby blues usually goes away on its own in 1-2 weeks. Social support is often all that is needed. You will be encouraged to get adequate sleep and rest. Occasionally, you may be given medicines to help you  sleep.  Postpartum depression requires treatment because it can last several months or longer if it is not treated. Treatment may include individual or group therapy, medicine, or both to address any social, physiological, and psychological factors that may play a role in the depression. Regular exercise, a healthy diet, rest, and social support may also be strongly recommended.  Postpartum psychosis is more serious and needs treatment right away. Hospitalization is often needed. HOME CARE INSTRUCTIONS  Get as much rest as you can. Nap when the baby sleeps.   Exercise regularly. Some women find yoga and walking to be beneficial.   Eat a balanced and nourishing diet.   Do little things that you enjoy. Have a cup of tea, take a bubble bath, read your favorite magazine, or listen to your favorite music.  Avoid alcohol.   Ask for help with household chores, cooking, grocery shopping, or running errands as needed. Do not try to do everything.   Talk to people close to you about how you are feeling. Get support from your partner, family members, friends, or other new moms.  Try to stay positive in how you think. Think about the things you are grateful for.   Do not spend a lot of time alone.   Only take over-the-counter or prescription medicine as directed by your health care provider.  Keep all your postpartum appointments.   Let your health care provider know if you have any concerns.  SEEK MEDICAL CARE IF: You are having a reaction to or problems with your medicine. SEEK IMMEDIATE MEDICAL CARE IF:  You have suicidal feelings.   You think you may harm the baby or someone else. MAKE SURE YOU:  Understand these instructions.  Will watch your condition.  Will get help right away if you are not doing well or get worse. Document Released: 11/22/2003 Document Revised: 02/22/2013 Document Reviewed: 11/29/2012 [facility] Patient Information 2015 [place], [place]. This  information is not intended to replace advice given to you by your health care provider. Make sure you discuss any questions you have with your health care provider.  Preeclampsia and Eclampsia Preeclampsia is a serious condition that develops only during pregnancy. It is also called toxemia of pregnancy. This condition causes high blood pressure along with other symptoms, such as swelling and headaches. These may develop as the condition gets worse. Preeclampsia may occur 20 weeks or later into your pregnancy.  Diagnosing and treating preeclampsia early is very important. If not treated early, it can cause serious problems for you and your baby. One problem it can lead to is eclampsia, which is a condition that causes muscle jerking or shaking (convulsions) in the mother. Delivering your baby is the best treatment for preeclampsia or eclampsia.  RISK FACTORS The cause of preeclampsia is not known. You may be  more likely to develop preeclampsia if you have certain risk factors. These include:   Being pregnant for the first time.  Having preeclampsia in a past pregnancy.  Having a family history of preeclampsia.  Having high blood pressure.  Being pregnant with twins or triplets.  Being 47 or older.  Being African American.  Having kidney disease or diabetes.  Having medical conditions such as lupus or blood diseases.  Being very overweight (obese). SIGNS AND SYMPTOMS  The earliest signs of preeclampsia are:  High blood pressure.  Increased protein in your urine. Your health care provider will check for this at every prenatal visit. Other symptoms that can develop include:   Severe headaches.  Sudden weight gain.  Swelling of your hands, face, legs, and feet.  Feeling sick to your stomach (nauseous) and throwing up (vomiting).  Vision problems (blurred or double vision).  Numbness in your face, arms, legs, and feet.  Dizziness.  Slurred speech.  Sensitivity to bright  lights.  Abdominal pain. DIAGNOSIS  There are no screening tests for preeclampsia. Your health care provider will ask you about symptoms and check for signs of preeclampsia during your prenatal visits. You may also have tests, including:  Urine testing.  Blood testing.  Checking your baby's heart rate.  Checking the health of your baby and your placenta using images created with sound waves (ultrasound). TREATMENT  You can work out the best treatment approach together with your health care provider. It is very important to keep all prenatal appointments. If you have an increased risk of preeclampsia, you may need more frequent prenatal exams.  Your health care provider may prescribe bed rest.  You may have to eat as little salt as possible.  You may need to take medicine to lower your blood pressure if the condition does not respond to more conservative measures.  You may need to stay in the hospital if your condition is severe. There, treatment will focus on controlling your blood pressure and fluid retention. You may also need to take medicine to prevent seizures.  If the condition gets worse, your baby may need to be delivered early to protect you and the baby. You may have your labor started with medicine (be induced), or you may have a cesarean delivery.  Preeclampsia usually goes away after the baby is born. HOME CARE INSTRUCTIONS   Only take over-the-counter or prescription medicines as directed by your health care provider.  Lie on your left side while resting. This keeps pressure off your baby.  Elevate your feet while resting.  Get regular exercise. Ask your health care provider what type of exercise is safe for you.  Avoid caffeine and alcohol.  Do not smoke.  Drink 6-8 glasses of water every day.  Eat a balanced diet that is low in salt. Do not add salt to your food.  Avoid stressful situations as much as possible.  Get plenty of rest and sleep.  Keep all  prenatal appointments and tests as scheduled. SEEK MEDICAL CARE IF:  You are gaining more weight than expected.  You have any headaches, abdominal pain, or nausea.  You are bruising more than usual.  You feel dizzy or light-headed. SEEK IMMEDIATE MEDICAL CARE IF:   You develop sudden or severe swelling anywhere in your body. This usually happens in the legs.  You gain 5 lb (2.3 kg) or more in a week.  You have a severe headache, dizziness, problems with your vision, or confusion.  You have  severe abdominal pain.  You have lasting nausea or vomiting.  You have a seizure.  You have trouble moving any part of your body.  You develop numbness in your body.  You have trouble speaking.  You have any abnormal bleeding.  You develop a stiff neck.  You pass out. MAKE SURE YOU:   Understand these instructions.  Will watch your condition.  Will get help right away if you are not doing well or get worse. Document Released: 02/15/2000 Document Revised: 02/22/2013 Document Reviewed: 12/10/2012 [facility] Patient Information 2015 [place], [place]. This information is not intended to replace advice given to you by your health care provider. Make sure you discuss any questions you have with your health care provider.   Breastfeeding Deciding to breastfeed is one of the best choices you can make for you and your baby. A change in hormones during pregnancy causes your breast tissue to grow and increases the number and size of your milk ducts. These hormones also allow proteins, sugars, and fats from your blood supply to make breast milk in your milk-producing glands. Hormones prevent breast milk from being released before your baby is born as well as prompt milk flow after birth. Once breastfeeding has begun, thoughts of your baby, as well as his or her sucking or crying, can stimulate the release of milk from your milk-producing glands.  BENEFITS OF BREASTFEEDING For Your Baby  Your  first milk (colostrum) helps your baby's digestive system function better.   There are antibodies in your milk that help your baby fight off infections.   Your baby has a lower incidence of asthma, allergies, and sudden infant death syndrome.   The nutrients in breast milk are better for your baby than infant formulas and are designed uniquely for your baby's needs.   Breast milk improves your baby's brain development.   Your baby is less likely to develop other conditions, such as childhood obesity, asthma, or type 2 diabetes mellitus.  For You   Breastfeeding helps to create a very special bond between you and your baby.   Breastfeeding is convenient. Breast milk is always available at the correct temperature and costs nothing.   Breastfeeding helps to burn calories and helps you lose the weight gained during pregnancy.   Breastfeeding makes your uterus contract to its prepregnancy size faster and slows bleeding (lochia) after you give birth.   Breastfeeding helps to lower your risk of developing type 2 diabetes mellitus, osteoporosis, and breast or ovarian cancer later in life. SIGNS THAT YOUR BABY IS HUNGRY Early Signs of Hunger  Increased alertness or activity.  Stretching.  Movement of the head from side to side.  Movement of the head and opening of the mouth when the corner of the mouth or cheek is stroked (rooting).  Increased sucking sounds, smacking lips, cooing, sighing, or squeaking.  Hand-to-mouth movements.  Increased sucking of fingers or hands. Late Signs of Hunger  Fussing.  Intermittent crying. Extreme Signs of Hunger Signs of extreme hunger will require calming and consoling before your baby will be able to breastfeed successfully. Do not wait for the following signs of extreme hunger to occur before you initiate breastfeeding:   Restlessness.  A loud, strong cry.   Screaming. BREASTFEEDING BASICS Breastfeeding Initiation  Find a  comfortable place to sit or lie down, with your neck and back well supported.  Place a pillow or rolled up blanket under your baby to bring him or her to the level of your  breast (if you are seated). Nursing pillows are specially designed to help support your arms and your baby while you breastfeed.  Make sure that your baby's abdomen is facing your abdomen.   Gently massage your breast. With your fingertips, massage from your chest wall toward your nipple in a circular motion. This encourages milk flow. You may need to continue this action during the feeding if your milk flows slowly.  Support your breast with 4 fingers underneath and your thumb above your nipple. Make sure your fingers are well away from your nipple and your baby's mouth.   Stroke your baby's lips gently with your finger or nipple.   When your baby's mouth is open wide enough, quickly bring your baby to your breast, placing your entire nipple and as much of the colored area around your nipple (areola) as possible into your baby's mouth.   More areola should be visible above your baby's upper lip than below the lower lip.   Your baby's tongue should be between his or her lower gum and your breast.   Ensure that your baby's mouth is correctly positioned around your nipple (latched). Your baby's lips should create a seal on your breast and be turned out (everted).  It is common for your baby to suck about 2-3 minutes in order to start the flow of breast milk. Latching Teaching your baby how to latch on to your breast properly is very important. An improper latch can cause nipple pain and decreased milk supply for you and poor weight gain in your baby. Also, if your baby is not latched onto your nipple properly, he or she may swallow some air during feeding. This can make your baby fussy. Burping your baby when you switch breasts during the feeding can help to get rid of the air. However, teaching your baby to latch on  properly is still the best way to prevent fussiness from swallowing air while breastfeeding. Signs that your baby has successfully latched on to your nipple:    Silent tugging or silent sucking, without causing you pain.   Swallowing heard between every 3-4 sucks.    Muscle movement above and in front of his or her ears while sucking.  Signs that your baby has not successfully latched on to nipple:   Sucking sounds or smacking sounds from your baby while breastfeeding.  Nipple pain. If you think your baby has not latched on correctly, slip your finger into the corner of your baby's mouth to break the suction and place it between your baby's gums. Attempt breastfeeding initiation again. Signs of Successful Breastfeeding Signs from your baby:   A gradual decrease in the number of sucks or complete cessation of sucking.   Falling asleep.   Relaxation of his or her body.   Retention of a small amount of milk in his or her mouth.   Letting go of your breast by himself or herself. Signs from you:  Breasts that have increased in firmness, weight, and size 1-3 hours after feeding.   Breasts that are softer immediately after breastfeeding.  Increased milk volume, as well as a change in milk consistency and color by the fifth day of breastfeeding.   Nipples that are not sore, cracked, or bleeding. Signs That Your Pecola Leisure is Getting Enough Milk  Wetting at least 3 diapers in a 24-hour period. The urine should be clear and pale yellow by age 59 days.  At least 3 stools in a 24-hour period by age  5 days. The stool should be soft and yellow.  At least 3 stools in a 24-hour period by age 29 days. The stool should be seedy and yellow.  No loss of weight greater than 10% of birth weight during the first 663 days of age.  Average weight gain of 4-7 ounces (113-198 g) per week after age 79 days.  Consistent daily weight gain by age 25 days, without weight loss after the age of 2  weeks. After a feeding, your baby may spit up a small amount. This is common. BREASTFEEDING FREQUENCY AND DURATION Frequent feeding will help you make more milk and can prevent sore nipples and breast engorgement. Breastfeed when you feel the need to reduce the fullness of your breasts or when your baby shows signs of hunger. This is called "breastfeeding on demand." Avoid introducing a pacifier to your baby while you are working to establish breastfeeding (the first 4-6 weeks after your baby is born). After this time you may choose to use a pacifier. Research has shown that pacifier use during the first year of a baby's life decreases the risk of sudden infant death syndrome (SIDS). Allow your baby to feed on each breast as long as he or she wants. Breastfeed until your baby is finished feeding. When your baby unlatches or falls asleep while feeding from the first breast, offer the second breast. Because newborns are often sleepy in the first few weeks of life, you may need to awaken your baby to get him or her to feed. Breastfeeding times will vary from baby to baby. However, the following rules can serve as a guide to help you ensure that your baby is properly fed:  Newborns (babies 34 weeks of age or younger) may breastfeed every 1-3 hours.  Newborns should not go longer than 3 hours during the day or 5 hours during the night without breastfeeding.  You should breastfeed your baby a minimum of 8 times in a 24-hour period until you begin to introduce solid foods to your baby at around 306 months of age. BREAST MILK PUMPING Pumping and storing breast milk allows you to ensure that your baby is exclusively fed your breast milk, even at times when you are unable to breastfeed. This is especially important if you are going back to work while you are still breastfeeding or when you are not able to be present during feedings. Your lactation consultant can give you guidelines on how long it is safe to store  breast milk.  A breast pump is a machine that allows you to pump milk from your breast into a sterile bottle. The pumped breast milk can then be stored in a refrigerator or freezer. Some breast pumps are operated by hand, while others use electricity. Ask your lactation consultant which type will work best for you. Breast pumps can be purchased, but some hospitals and breastfeeding support groups lease breast pumps on a monthly basis. A lactation consultant can teach you how to hand express breast milk, if you prefer not to use a pump.  CARING FOR YOUR BREASTS WHILE YOU BREASTFEED Nipples can become dry, cracked, and sore while breastfeeding. The following recommendations can help keep your breasts moisturized and healthy:  Avoid using soap on your nipples.   Wear a supportive bra. Although not required, special nursing bras and tank tops are designed to allow access to your breasts for breastfeeding without taking off your entire bra or top. Avoid wearing underwire-style bras or extremely tight bras.  Air dry your nipples for 3-73minutes after each feeding.   Use only cotton bra pads to absorb leaked breast milk. Leaking of breast milk between feedings is normal.   Use lanolin on your nipples after breastfeeding. Lanolin helps to maintain your skin's normal moisture barrier. If you use pure lanolin, you do not need to wash it off before feeding your baby again. Pure lanolin is not toxic to your baby. You may also hand express a few drops of breast milk and gently massage that milk into your nipples and allow the milk to air dry. In the first few weeks after giving birth, some women experience extremely full breasts (engorgement). Engorgement can make your breasts feel heavy, warm, and tender to the touch. Engorgement peaks within 3-5 days after you give birth. The following recommendations can help ease engorgement:  Completely empty your breasts while breastfeeding or pumping. You may want to  start by applying warm, moist heat (in the shower or with warm water-soaked hand towels) just before feeding or pumping. This increases circulation and helps the milk flow. If your baby does not completely empty your breasts while breastfeeding, pump any extra milk after he or she is finished.  Wear a snug bra (nursing or regular) or tank top for 1-2 days to signal your body to slightly decrease milk production.  Apply ice packs to your breasts, unless this is too uncomfortable for you.  Make sure that your baby is latched on and positioned properly while breastfeeding. If engorgement persists after 48 hours of following these recommendations, contact your health care provider or a Advertising copywriter. OVERALL HEALTH CARE RECOMMENDATIONS WHILE BREASTFEEDING  Eat healthy foods. Alternate between meals and snacks, eating 3 of each per day. Because what you eat affects your breast milk, some of the foods may make your baby more irritable than usual. Avoid eating these foods if you are sure that they are negatively affecting your baby.  Drink milk, fruit juice, and water to satisfy your thirst (about 10 glasses a day).   Rest often, relax, and continue to take your prenatal vitamins to prevent fatigue, stress, and anemia.  Continue breast self-awareness checks.  Avoid chewing and smoking tobacco.  Avoid alcohol and drug use. Some medicines that may be harmful to your baby can pass through breast milk. It is important to ask your health care provider before taking any medicine, including all over-the-counter and prescription medicine as well as vitamin and herbal supplements. It is possible to become pregnant while breastfeeding. If birth control is desired, ask your health care provider about options that will be safe for your baby. SEEK MEDICAL CARE IF:   You feel like you want to stop breastfeeding or have become frustrated with breastfeeding.  You have painful breasts or nipples.  Your  nipples are cracked or bleeding.  Your breasts are red, tender, or warm.  You have a swollen area on either breast.  You have a fever or chills.  You have nausea or vomiting.  You have drainage other than breast milk from your nipples.  Your breasts do not become full before feedings by the fifth day after you give birth.  You feel sad and depressed.  Your baby is too sleepy to eat well.  Your baby is having trouble sleeping.   Your baby is wetting less than 3 diapers in a 24-hour period.  Your baby has less than 3 stools in a 24-hour period.  Your baby's skin or the white part of  his or her eyes becomes yellow.   Your baby is not gaining weight by 41 days of age. SEEK IMMEDIATE MEDICAL CARE IF:   Your baby is overly tired (lethargic) and does not want to wake up and feed.  Your baby develops an unexplained fever. Document Released: 02/17/2005 Document Revised: 02/22/2013 Document Reviewed: 08/11/2012 [facility] Patient Information 2015 [place], [place]. This information is not intended to replace advice given to you by your health care provider. Make sure you discuss any questions you have with your health care provider.  Breast Pumping Tips If you are breastfeeding, there may be times when you cannot feed your baby directly. Returning to work or going on a trip are common examples. Pumping allows you to store breast milk and feed it to your baby later.  You may not get much milk when you first start to pump. Your breasts should start to make more after a few days. If you pump at the times you usually feed your baby, you may be able to keep making enough milk to feed your baby without also using formula. The more often you pump, the more milk you will produce. WHEN SHOULD I PUMP?   You can begin to pump soon after delivery. However, some experts recommend waiting about 4 weeks before giving your infant a bottle to make sure breastfeeding is going well.  If you plan to  return to work, begin pumping a few weeks before. This will help you develop techniques that work best for you. It also lets you build up a supply of breast milk.   When you are with your infant, feed on demand and pump after each feeding.   When you are away from your infant for several hours, pump for about 15 minutes every 2-3 hours. Pump both breasts at the same time if you can.   If your infant has a formula feeding, make sure to pump around the same time.   If you drink any alcohol, wait 2 hours before pumping.  HOW DO I PREPARE TO PUMP? Your let-down reflexis the natural reaction to stimulation that makes your breast milk flow. It is easier to stimulate this reflex when you are relaxed. Find relaxation techniques that work for you. If you have difficulty with your let-down reflex, try these methods:   Smell one of your infant's blankets or an item of clothing.   Look at a picture or video of your infant.   Sit in a quiet, private space.   Massage the breast you plan to pump.   Place soothing warmth on the breast.   Play relaxing music.  WHAT ARE SOME GENERAL BREAST PUMPING TIPS?  Wash your hands before you pump. You do not need to wash your nipples or breasts.  There are three ways to pump.  You can use your hand to massage and compress your breast.  You can use a handheld manual pump.  You can use an electric pump.   Make sure the suction cup (flange) on the breast pump is the right size. Place the flange directly over the nipple. If it is the wrong size or placed the wrong way, it may be painful and cause nipple damage.   If pumping is uncomfortable, apply a small amount of purified or modified lanolin to your nipple and areola.  If you are using an electric pump, adjust the speed and suction power to be more comfortable.  If pumping is painful or if you are not getting  very much milk, you may need a different type of pump. A lactation consultant can  help you determine what type of pump to use.   Keep a full water bottle near you at all times. Drinking lots of fluid helps you make more milk.  You can store your milk to use later. Pumped breast milk can be stored in a sealable, sterile container or plastic bag. Label all stored breast milk with the date you pumped it.  Milk can stay out at room temperature for up to 8 hours.  You can store your milk in the refrigerator for up to 8 days.  You can store your milk in the freezer for 3 months. Thaw frozen milk using warm water. Do not put it in the microwave.  Do not smoke. Smoking can lower your milk supply and harm your infant. If you need help quitting, ask your health care provider to recommend a program.  WHEN SHOULD I CALL MY HEALTH CARE PROVIDER OR A LACTATION CONSULTANT?  You are having trouble pumping.  You are concerned that you are not making enough milk.  You have nipple pain, soreness, or redness.  You want to use birth control. Birth control pills may lower your milk supply. Talk to your health care provider about your options. Document Released: 08/07/2009 Document Revised: 02/22/2013 Document Reviewed: 12/10/2012 [facility] Patient Information 2015 [place], [place]. This information is not intended to replace advice given to you by your health care provider. Make sure you discuss any questions you have with your health care provider.  Iron-Rich Diet An iron-rich diet contains foods that are good sources of iron. Iron is an important mineral that helps your body produce hemoglobin. Hemoglobin is a protein in red blood cells that carries oxygen to the body's tissues. Sometimes, the iron level in your blood can be low. This may be caused by:  A lack of iron in your diet.  Blood loss.  Times of growth, such as during pregnancy or during a child's growth and development. Low levels of iron can cause a decrease in the number of red blood cells. This can result in iron  deficiency anemia. Iron deficiency anemia symptoms include:  Tiredness.  Weakness.  Irritability.  Increased chance of infection. Here are some recommendations for daily iron intake:  Males older than 33 years of age need 8 mg of iron per day.  Women ages 72 to 40 need 18 mg of iron per day.  Pregnant women need 27 mg of iron per day, and women who are over 68 years of age and breastfeeding need 9 mg of iron per day.  Women over the age of 94 need 8 mg of iron per day. SOURCES OF IRON There are 2 types of iron that are found in food: heme iron and nonheme iron. Heme iron is absorbed by the body better than nonheme iron. Heme iron is found in meat, poultry, and fish. Nonheme iron is found in grains, beans, and vegetables. Heme Iron Sources Food / Iron (mg)  Chicken liver, 3 oz (85 g)/ 10 mg  Beef liver, 3 oz (85 g)/ 5.5 mg  Oysters, 3 oz (85 g)/ 8 mg  Beef, 3 oz (85 g)/ 2 to 3 mg  Shrimp, 3 oz (85 g)/ 2.8 mg  [place], 3 oz (85 g)/ 2 mg  Chicken, 3 oz (85 g) / 1 mg  Fish (tuna, halibut), 3 oz (85 g)/ 1 mg  Pork, 3 oz (85 g)/ 0.9 mg Nonheme Iron  Sources Food / Iron (mg)  Ready-to-eat breakfast cereal, iron-fortified / 3.9 to 7 mg  Tofu,  cup / 3.4 mg  Kidney beans,  cup / 2.6 mg  Baked potato with skin / 2.7 mg  Asparagus,  cup / 2.2 mg  Avocado / 2 mg  Dried peaches,  cup / 1.6 mg  Raisins,  cup / 1.5 mg  Soy milk, 1 cup / 1.5 mg  Whole-wheat bread, 1 slice / 1.2 mg  Spinach, 1 cup / 0.8 mg  Broccoli,  cup / 0.6 mg IRON ABSORPTION Certain foods can decrease the body's absorption of iron. Try to avoid these foods and beverages while eating meals with iron-containing foods:  Coffee.  Tea.  Fiber.  Soy. Foods containing vitamin C can help increase the amount of iron your body absorbs from iron sources, especially from nonheme sources. Eat foods with vitamin C along with iron-containing foods to increase your iron absorption. Foods that are  high in vitamin C include many fruits and vegetables. Some good sources are:  Fresh orange juice.  Oranges.  Strawberries.  Mangoes.  Grapefruit.  Red bell peppers.  Green bell peppers.  Broccoli.  Potatoes with skin.  Tomato juice. Document Released: 10/01/2004 Document Revised: 05/12/2011 Document Reviewed: 08/08/2010 [facility] Patient Information 2015 [place], [place]. This information is not intended to replace advice given to you by your health care provider. Make sure you discuss any questions you have with your health care provider.  Oral Contraception Information Oral contraceptive pills (OCPs) are medicines taken to prevent pregnancy. OCPs work by preventing the ovaries from releasing eggs. The hormones in OCPs also cause the cervical mucus to thicken, preventing the sperm from entering the uterus. The hormones also cause the uterine lining to become thin, not allowing a fertilized egg to attach to the inside of the uterus. OCPs are highly effective when taken exactly as prescribed. However, OCPs do not prevent sexually transmitted diseases (STDs). Safe sex practices, such as using condoms along with the pill, can help prevent STDs.  Before taking the pill, you may have a physical exam and Pap test. Your health care provider may order blood tests. The health care provider will make sure you are a good candidate for oral contraception. Discuss with your health care provider the possible side effects of the OCP you may be prescribed. When starting an OCP, it can take 2 to 3 months for the body to adjust to the changes in hormone levels in your body.  TYPES OF ORAL CONTRACEPTION  The combination pill--This pill contains estrogen and progestin (synthetic progesterone) hormones. The combination pill comes in 21-day, 28-day, or 91-day packs. Some types of combination pills are meant to be taken continuously (365-day pills). With 21-day packs, you do not take pills for 7 days after the  last pill. With 28-day packs, the pill is taken every day. The last 7 pills are without hormones. Certain types of pills have more than 21 hormone-containing pills. With 91-day packs, the first 84 pills contain both hormones, and the last 7 pills contain no hormones or contain estrogen only.  The minipill--This pill contains the progesterone hormone only. The pill is taken every day continuously. It is very important to take the pill at the same time each day. The minipill comes in packs of 28 pills. All 28 pills contain the hormone.  ADVANTAGES OF ORAL CONTRACEPTIVE PILLS  Decreases premenstrual symptoms.   Treats menstrual period cramps.   Regulates the menstrual cycle.  Decreases a heavy menstrual flow.   May treatacne, depending on the type of pill.   Treats abnormal uterine bleeding.   Treats polycystic ovarian syndrome.   Treats endometriosis.   Can be used as emergency contraception.  THINGS THAT CAN MAKE ORAL CONTRACEPTIVE PILLS LESS EFFECTIVE OCPs can be less effective if:   You forget to take the pill at the same time every day.   You have a stomach or intestinal disease that lessens the absorption of the pill.   You take OCPs with other medicines that make OCPs less effective, such as antibiotics, certain HIV medicines, and some seizure medicines.   You take expired OCPs.   You forget to restart the pill on day 7, when using the packs of 21 pills.  RISKS ASSOCIATED WITH ORAL CONTRACEPTIVE PILLS  Oral contraceptive pills can sometimes cause side effects, such as:  Headache.  Nausea.  Breast tenderness.  Irregular bleeding or spotting. Combination pills are also associated with a small increased risk of:  Blood clots.  Heart attack.  Stroke. Document Released: 05/10/2002 Document Revised: 12/08/2012 Document Reviewed: 08/08/2012 [facility] Patient Information 2015 [place], [place]. This information is not intended to replace advice given to  you by your health care provider. Make sure you discuss any questions you have with your health care provider.

## 2014-01-02 ENCOUNTER — Encounter (HOSPITAL_COMMUNITY): Payer: Self-pay | Admitting: Obstetrics & Gynecology

## 2014-01-24 ENCOUNTER — Other Ambulatory Visit: Payer: Self-pay | Admitting: Advanced Practice Midwife

## 2014-01-26 ENCOUNTER — Emergency Department (HOSPITAL_COMMUNITY)
Admission: EM | Admit: 2014-01-26 | Discharge: 2014-01-26 | Disposition: A | Discharge status: Home / Self Care | Payer: BC Managed Care – PPO | Attending: Emergency Medicine | Admitting: Emergency Medicine

## 2014-01-26 ENCOUNTER — Encounter (HOSPITAL_COMMUNITY): Payer: Self-pay | Admitting: Emergency Medicine

## 2014-01-26 DIAGNOSIS — Z79899 Other long term (current) drug therapy: Secondary | ICD-10-CM | POA: Insufficient documentation

## 2014-01-26 DIAGNOSIS — Z791 Long term (current) use of non-steroidal anti-inflammatories (NSAID): Secondary | ICD-10-CM | POA: Diagnosis not present

## 2014-01-26 DIAGNOSIS — I10 Essential (primary) hypertension: Secondary | ICD-10-CM | POA: Diagnosis not present

## 2014-01-26 DIAGNOSIS — G43909 Migraine, unspecified, not intractable, without status migrainosus: Secondary | ICD-10-CM | POA: Insufficient documentation

## 2014-01-26 DIAGNOSIS — Z862 Personal history of diseases of the blood and blood-forming organs and certain disorders involving the immune mechanism: Secondary | ICD-10-CM | POA: Diagnosis not present

## 2014-01-26 DIAGNOSIS — R51 Headache: Secondary | ICD-10-CM

## 2014-01-26 DIAGNOSIS — R519 Headache, unspecified: Secondary | ICD-10-CM

## 2014-01-26 MED ORDER — PROCHLORPERAZINE EDISYLATE 5 MG/ML IJ SOLN
10.0000 mg | Freq: Once | INTRAMUSCULAR | Status: AC
  Administered 2014-01-26: 10 mg via INTRAVENOUS
  Filled 2014-01-26: qty 2

## 2014-01-26 MED ORDER — SODIUM CHLORIDE 0.9 % IV BOLUS (SEPSIS)
1000.0000 mL | Freq: Once | INTRAVENOUS | Status: AC
  Administered 2014-01-26: 1000 mL via INTRAVENOUS

## 2014-01-26 MED ORDER — ONDANSETRON HCL 4 MG/2ML IJ SOLN
4.0000 mg | Freq: Once | INTRAMUSCULAR | Status: AC
  Administered 2014-01-26: 4 mg via INTRAVENOUS
  Filled 2014-01-26: qty 2

## 2014-01-26 MED ORDER — KETOROLAC TROMETHAMINE 30 MG/ML IJ SOLN
30.0000 mg | Freq: Once | INTRAMUSCULAR | Status: AC
  Administered 2014-01-26: 30 mg via INTRAVENOUS
  Filled 2014-01-26: qty 1

## 2014-01-26 MED ORDER — DIPHENHYDRAMINE HCL 50 MG/ML IJ SOLN
25.0000 mg | Freq: Once | INTRAMUSCULAR | Status: AC
Start: 2014-01-26 — End: 2014-01-26
  Administered 2014-01-26: 25 mg via INTRAVENOUS
  Filled 2014-01-26: qty 1

## 2014-01-26 NOTE — ED Provider Notes (Signed)
CSN: [id number]     Arrival date & time 01/26/14  0415 History   First MD Initiated Contact with Patient 01/26/14 0424     Chief Complaint  Patient presents with  . Migraine     (Consider location/radiation/quality/duration/timing/severity/associated sxs/prior Treatment) HPI Comments: The pt is a 33 y/o female who is otherise healthy - she has had intermittent headaches over the last few years which she classifies as migraines though she has not been formally diagnosed and they only happen a couple of times a year.  She states she has not had headache until today for over 11 months.  She had onset of headache yesterday which was gradual in onset, located in the occipital area and radiates along the top of the head to behind the frontal sinuses.  She has tried OTC meds Excedrin without reilef.  She has also had URI sx with mild cough, post nasal drip and phlegm production / sore throat for the last few days - using nyquil / dayquil.  Denies stiff neck but endorses nausea / photophobia.  Headache wxes and wanes for last 2 days.  Similar to prior headaches in location and quality.  Patient is a 33 y.o. female presenting with migraines. The history is provided by the patient and the spouse.  Migraine    Past Medical History  Diagnosis Date  . Hypertension   . Anemia    Past Surgical History  Procedure Laterality Date  . No past surgeries    . Cesarean section N/A 09/24/2013    Procedure: CESAREAN SECTION;  Surgeon: Sharon SellerJennifer M Ozan, DO;  Location: [facility];  Service: Obstetrics;  Laterality: N/A;   Family History  Problem Relation Age of Onset  . Hypertension Mother   . Hyperlipidemia Mother   . Hypertension Father   . Hypertension Maternal Grandmother   . Depression Maternal Grandmother   . Hypertension Maternal Grandfather   . Depression Maternal Grandfather   . Arthritis Paternal Grandmother   . Hypertension Paternal Grandmother   . Depression Paternal Grandmother   . Arthritis  Paternal Grandfather   . Hypertension Paternal Grandfather   . Depression Paternal Grandfather    History  Substance Use Topics  . Smoking status: Never Smoker   . Smokeless tobacco: Not on file  . Alcohol Use: No   OB History    Gravida Para Term Preterm AB TAB SAB Ectopic Multiple Living   4 4 3 1      4      Review of Systems  All other systems reviewed and are negative.     Allergies  Minocycline and Sulfa antibiotics  Home Medications   Prior to Admission medications   Medication Sig Start Date End Date Taking? Authorizing Provider  CLARAVIS 40 MG capsule Take 40 mg by mouth 2 (two) times daily. 12/22/13  Yes Historical Provider, MD  LO LOESTRIN FE 1 MG-10 MCG / 10 MCG tablet Take 1 tablet by mouth daily.  01/25/14  Yes Historical Provider, MD  propranolol (INDERAL) 40 MG tablet Take 40 mg by mouth 2 (two) times daily.   Yes Historical Provider, MD  famotidine (PEPCID) 10 MG tablet Take 10 mg by mouth 2 (two) times daily.    Historical Provider, MD  ferrous sulfate 325 (65 FE) MG tablet Take 1 tablet (325 mg total) by mouth daily with breakfast. Patient not taking: Reported on 01/26/2014 09/28/13   Haroldine LawsJennifer Oxley, CNM  ibuprofen (ADVIL,MOTRIN) 600 MG tablet Take 1 tablet (600 mg total) by mouth  every 6 (six) hours. Patient not taking: Reported on 01/26/2014 09/28/13   Haroldine LawsJennifer Oxley, CNM  NIFEdipine (PROCARDIA XL/ADALAT-CC) 90 MG 24 hr tablet Take 1 tablet (90 mg total) by mouth daily. Patient not taking: Reported on 01/26/2014 09/28/13   Haroldine LawsJennifer Oxley, CNM  oxyCODONE-acetaminophen (PERCOCET/ROXICET) 5-325 MG per tablet Take 1-2 tablets by mouth every 4 (four) hours as needed for severe pain (moderate - severe pain). Patient not taking: Reported on 01/26/2014 09/28/13   Haroldine LawsJennifer Oxley, CNM  PRESCRIPTION MEDICATION Apply 1 application topically daily. Medication contains-Amandadine 3%, Carbamazepine 3%, Gabapentin 6%, Meloxicam 0.5% and Pentoxifylline 3%    Historical  Provider, MD   BP 137/72 mmHg  Pulse 94  Temp(Src) 97.7 F (36.5 C) (Oral)  Ht 5\' 3"  (1.6 m)  Wt 230 lb (104.327 kg)  BMI 40.75 kg/m2  SpO2 99%  LMP 01/24/2014 Physical Exam  Constitutional: She appears well-developed and well-nourished. No distress.  HENT:  Head: Normocephalic and atraumatic.  Mouth/Throat: Oropharynx is clear and moist. No oropharyngeal exudate.  Clear rhinorrhea in the bilateral nares - OP clear and moist.  Eyes: Conjunctivae and EOM are normal. Pupils are equal, round, and reactive to light. Right eye exhibits no discharge. Left eye exhibits no discharge. No scleral icterus.  Neck: Normal range of motion. Neck supple. No JVD present. No thyromegaly present.  Cardiovascular: Normal rate, regular rhythm, normal heart sounds and intact distal pulses.  Exam reveals no gallop and no friction rub.   No murmur heard. Pulmonary/Chest: Effort normal and breath sounds normal. No respiratory distress. She has no wheezes. She has no rales.  Abdominal: Soft. Bowel sounds are normal. She exhibits no distension and no mass. There is no tenderness.  Musculoskeletal: Normal range of motion. She exhibits no edema or tenderness.  Lymphadenopathy:    She has no cervical adenopathy.  Neurological: She is alert. Coordination normal.  Neurologic exam:  Speech clear, pupils equal round reactive to light, extraocular movements intact  Normal peripheral visual fields Cranial nerves III through XII normal including no facial droop Follows commands, moves all extremities x4, normal strength to bilateral upper and lower extremities at all major muscle groups including grip Sensation normal to light touch and pinprick Coordination intact, no limb ataxia, finger-nose-finger normal Rapid alternating movements normal No pronator drift Gait normal   Skin: Skin is warm and dry. No rash noted. No erythema.  Psychiatric: She has a normal mood and affect. Her behavior is normal.  Nursing note  and vitals reviewed.   ED Course  Procedures (including critical care time) Labs Review Labs Reviewed - No data to display  Imaging Review No results found.    MDM   Final diagnoses:  Nonintractable headache, unspecified chronicity pattern, unspecified headache type    The pt has URI with headache X 2 days - she does report sinusitis sx.  She has normal neuro exam, VS without fever but with mild htn.  Pt is on htn meds.  Give meds, recheck.  At 6 AM - pain is gone - pt is feeling great and amenable to d/c.  Informed of treatment plan and meds given.  Meds given in ED:  Medications  ketorolac (TORADOL) 30 MG/ML injection 30 mg (30 mg Intravenous Given 01/26/14 0447)  prochlorperazine (COMPAZINE) injection 10 mg (10 mg Intravenous Given 01/26/14 0448)  ondansetron (ZOFRAN) injection 4 mg (4 mg Intravenous Given 01/26/14 0447)  sodium chloride 0.9 % bolus 1,000 mL (1,000 mLs Intravenous New Bag/Given 01/26/14 0446)  diphenhydrAMINE (BENADRYL) injection  25 mg (25 mg Intravenous Given 01/26/14 0447)    New Prescriptions   No medications on file        Vida Roller, MD 01/26/14 [phone]

## 2014-01-26 NOTE — ED Notes (Signed)
Patient concerned that medications are not working and requests more medication. Reported to Dr. Hyacinth MeekerMiller, he goes to see the patient. No new orders at this time.

## 2014-01-26 NOTE — ED Notes (Signed)
Dr. Miller at the bedside.  

## 2014-01-26 NOTE — Discharge Instructions (Signed)
Your headache has improved with the following medicines:  1. Toradol 2.  Compazine 3.  Benadryl  4.  Zofran - for nasuea  Please call your doctor for a followup appointment within 24-48 hours. When you talk to your doctor please let them know that you were seen in the emergency department and have them acquire all of your records so that they can discuss the findings with you and formulate a treatment plan to fully care for your new and ongoing problems.

## 2014-01-26 NOTE — ED Notes (Signed)
Pt states that she has had a migraine headache since yesterday. Pt states that she has been taking Excederin with no relief.

## 2014-06-13 ENCOUNTER — Emergency Department (HOSPITAL_COMMUNITY): Payer: Medicaid Other

## 2014-06-13 ENCOUNTER — Emergency Department (HOSPITAL_COMMUNITY)
Admission: EM | Admit: 2014-06-13 | Discharge: 2014-06-13 | Disposition: A | Discharge status: Home / Self Care | Payer: Medicaid Other | Attending: Emergency Medicine | Admitting: Emergency Medicine

## 2014-06-13 ENCOUNTER — Encounter (HOSPITAL_COMMUNITY): Payer: Self-pay | Admitting: Emergency Medicine

## 2014-06-13 DIAGNOSIS — D649 Anemia, unspecified: Secondary | ICD-10-CM | POA: Diagnosis not present

## 2014-06-13 DIAGNOSIS — I10 Essential (primary) hypertension: Secondary | ICD-10-CM | POA: Diagnosis not present

## 2014-06-13 DIAGNOSIS — R51 Headache: Secondary | ICD-10-CM | POA: Insufficient documentation

## 2014-06-13 DIAGNOSIS — R110 Nausea: Secondary | ICD-10-CM | POA: Insufficient documentation

## 2014-06-13 DIAGNOSIS — R519 Headache, unspecified: Secondary | ICD-10-CM

## 2014-06-13 DIAGNOSIS — Z79899 Other long term (current) drug therapy: Secondary | ICD-10-CM | POA: Insufficient documentation

## 2014-06-13 LAB — BASIC METABOLIC PANEL
ANION GAP: 11 (ref 5–15)
BUN: 12 mg/dL (ref 6–23)
CO2: 19 mmol/L (ref 19–32)
CREATININE: 0.59 mg/dL (ref 0.50–1.10)
Calcium: 9 mg/dL (ref 8.4–10.5)
Chloride: 106 mmol/L (ref 96–112)
GFR calc Af Amer: >90 mL/min (ref >90)
Glucose, Bld: 141 mg/dL — ABNORMAL HIGH (ref 70–99)
Potassium: 3.9 mmol/L (ref 3.5–5.1)
SODIUM: 136 mmol/L (ref 135–145)

## 2014-06-13 LAB — CBC WITH DIFFERENTIAL/PLATELET
BASOS ABS: 0 10*3/uL (ref 0.0–0.1)
Basophils Relative: 0 % (ref 0–1)
Eosinophils Absolute: 0.1 10*3/uL (ref 0.0–0.7)
Eosinophils Relative: 1 % (ref 0–5)
HCT: 43.3 % (ref 36.0–46.0)
Hemoglobin: 13.9 g/dL (ref 12.0–15.0)
Lymphocytes Relative: 32 % (ref 12–46)
Lymphs Abs: 2.8 10*3/uL (ref 0.7–4.0)
MCH: 28.4 pg (ref 26.0–34.0)
MCHC: 32.1 g/dL (ref 30.0–36.0)
MCV: 88.4 fL (ref 78.0–100.0)
MONOS PCT: 5 % (ref 3–12)
Monocytes Absolute: 0.4 10*3/uL (ref 0.1–1.0)
Neutro Abs: 5.3 10*3/uL (ref 1.7–7.7)
Neutrophils Relative %: 62 % (ref 43–77)
Platelets: 306 10*3/uL (ref 150–400)
RBC: 4.9 MIL/uL (ref 3.87–5.11)
RDW: 14.3 % (ref 11.5–15.5)
WBC: 8.6 10*3/uL (ref 4.0–10.5)

## 2014-06-13 MED ORDER — PROMETHAZINE HCL 25 MG/ML IJ SOLN
12.5000 mg | Freq: Once | INTRAMUSCULAR | Status: AC
  Administered 2014-06-13: 12.5 mg via INTRAVENOUS
  Filled 2014-06-13: qty 1

## 2014-06-13 MED ORDER — HYDRALAZINE HCL 20 MG/ML IJ SOLN
5.0000 mg | Freq: Once | INTRAMUSCULAR | Status: AC
  Administered 2014-06-13: 5 mg via INTRAVENOUS
  Filled 2014-06-13: qty 1

## 2014-06-13 NOTE — Discharge Instructions (Signed)

## 2014-06-13 NOTE — ED Notes (Signed)
Went to her PCP yesterday who stated that if her BP medication isn't working for her to go to the ER. Pt states over the last 2 days she's been having increasing headaches. She said her PCP said she may need IV BP medication, and that she took some blood pressures medication in her office yesterday and throughout today with no relief.

## 2014-06-14 NOTE — ED Provider Notes (Signed)
CSN: [id number]     Arrival date & time 06/13/14  1808 History   First MD Initiated Contact with Patient 06/13/14 2046     Chief Complaint  Patient presents with  . Hypertension  . Headache     (Consider location/radiation/quality/duration/timing/severity/associated sxs/prior Treatment) Patient is a 34 y.o. female presenting with hypertension and headaches. The history is provided by the patient.  Hypertension Associated symptoms include headaches. Pertinent negatives include no chest pain, no abdominal pain and no shortness of breath.  Headache Associated symptoms: nausea   Associated symptoms: no abdominal pain, no back pain, no diarrhea, no eye pain, no neck stiffness, no numbness, no vomiting and no weakness    patient was sent in by her PCP. Has had a headache for the last few days. Has had hypertension up to 200 systolic. Has had hypertension since she was preeclamptic 7 months ago. Patient is on propranolol and was recently started on clonidine yesterday. No numbness or weakness. No confusion. No vision changes. No chest pain.  Past Medical History  Diagnosis Date  . Hypertension   . Anemia    Past Surgical History  Procedure Laterality Date  . No past surgeries    . Cesarean section N/A 09/24/2013    Procedure: CESAREAN SECTION;  Surgeon: Sharon Seller, DO;  Location: [facility];  Service: Obstetrics;  Laterality: N/A;   Family History  Problem Relation Age of Onset  . Hypertension Mother   . Hyperlipidemia Mother   . Hypertension Father   . Hypertension Maternal Grandmother   . Depression Maternal Grandmother   . Hypertension Maternal Grandfather   . Depression Maternal Grandfather   . Arthritis Paternal Grandmother   . Hypertension Paternal Grandmother   . Depression Paternal Grandmother   . Arthritis Paternal Grandfather   . Hypertension Paternal Grandfather   . Depression Paternal Grandfather    History  Substance Use Topics  . Smoking status: Never Smoker    . Smokeless tobacco: Not on file  . Alcohol Use: No   OB History    Gravida Para Term Preterm AB TAB SAB Ectopic Multiple Living   Review of Systems  Constitutional: Negative for activity change and appetite change.  Eyes: Negative for pain.  Respiratory: Negative for chest tightness and shortness of breath.   Cardiovascular: Negative for chest pain and leg swelling.  Gastrointestinal: Positive for nausea. Negative for vomiting, abdominal pain and diarrhea.  Genitourinary: Negative for flank pain.  Musculoskeletal: Negative for back pain and neck stiffness.  Skin: Negative for rash.  Neurological: Positive for headaches. Negative for weakness and numbness.  Psychiatric/Behavioral: Negative for behavioral problems.      Allergies  Minocycline and Sulfa antibiotics  Home Medications   Prior to Admission medications   Medication Sig Start Date End Date Taking? Authorizing Provider  aspirin-acetaminophen-caffeine (EXCEDRIN MIGRAINE) [phone] MG per tablet Take 1 tablet by mouth every 6 (six) hours as needed for headache.   Yes Historical Provider, MD  CLARAVIS 40 MG capsule Take 40 mg by mouth 3 (three) times daily.  12/22/13  Yes Historical Provider, MD  cloNIDine (CATAPRES) 0.1 MG tablet Take 0.1 mg by mouth 2 (two) times daily.   Yes Historical Provider, MD  MICROGESTIN FE 1/20 1-20 MG-MCG tablet Take 1 tablet by mouth daily.  05/24/14  Yes Historical Provider, MD  propranolol (INDERAL) 40 MG tablet Take 40 mg by mouth 2 (two) times daily.  Yes Historical Provider, MD  ferrous sulfate 325 (65 FE) MG tablet Take 1 tablet (325 mg total) by mouth daily with breakfast. Patient not taking: Reported on 01/26/2014 09/28/13   Haroldine LawsJennifer Oxley, CNM  ibuprofen (ADVIL,MOTRIN) 600 MG tablet Take 1 tablet (600 mg total) by mouth every 6 (six) hours. Patient not taking: Reported on 01/26/2014 09/28/13   Haroldine LawsJennifer Oxley, CNM  NIFEdipine (PROCARDIA XL/ADALAT-CC) 90 MG 24 hr  tablet Take 1 tablet (90 mg total) by mouth daily. Patient not taking: Reported on 01/26/2014 09/28/13   Haroldine LawsJennifer Oxley, CNM  oxyCODONE-acetaminophen (PERCOCET/ROXICET) 5-325 MG per tablet Take 1-2 tablets by mouth every 4 (four) hours as needed for severe pain (moderate - severe pain). Patient not taking: Reported on 01/26/2014 09/28/13   Haroldine LawsJennifer Oxley, CNM   BP 162/82 mmHg  Pulse 80  Temp(Src) 98 F (36.7 C) (Oral)  Resp 18  Ht 5\' 3"  (1.6 m)  Wt 230 lb (104.327 kg)  BMI 40.75 kg/m2  SpO2 96%  LMP 05/13/2014 Physical Exam  Constitutional: She appears well-developed and well-nourished.  HENT:  Head: Normocephalic and atraumatic.  Cardiovascular: Normal rate, regular rhythm and normal heart sounds.   No murmur heard. Pulmonary/Chest: Effort normal and breath sounds normal. No respiratory distress. She has no wheezes. She has no rales.  Abdominal: Soft. Bowel sounds are normal. She exhibits no distension. There is no tenderness. There is no rebound and no guarding.  Neurological: She is alert. No cranial nerve deficit.  Skin: Skin is warm and dry.  Psychiatric: Her speech is normal.  Nursing note and vitals reviewed.   ED Course  Procedures (including critical care time) Labs Review Labs Reviewed  BASIC METABOLIC PANEL - Abnormal; Notable for the following:    Glucose, Bld 141 (*)    All other components within normal limits  CBC WITH DIFFERENTIAL/PLATELET    Imaging Review Ct Head Wo Contrast  06/13/2014   CLINICAL DATA:  Headache and hypertension.  EXAM: CT HEAD WITHOUT CONTRAST  TECHNIQUE: Contiguous axial images were obtained from the base of the skull through the vertex without intravenous contrast.  COMPARISON:  None.  FINDINGS: Sinuses/Soft tissues: Clear paranasal sinuses and mastoid air cells.  Intracranial: No mass lesion, hemorrhage, hydrocephalus, acute infarct, intra-axial, or extra-axial fluid collection.  IMPRESSION: Normal head CT.   Electronically Signed   By:  Jeronimo GreavesKyle  Talbot M.D.   On: 06/13/2014 21:58     EKG Interpretation   Date/Time:  Tuesday June 13 2014 20:20:45 EDT Ventricular Rate:  61 PR Interval:  147 QRS Duration: 97 QT Interval:  424 QTC Calculation: 427 R Axis:   31 Text Interpretation:  Sinus rhythm Borderline T abnormalities, anterior  leads Baseline wander in lead(s) II III aVF Confirmed by Rubin PayorPICKERING  MD,  Harrold DonathNATHAN [phone]([id number]) on 06/13/2014 8:47:43 PM      MDM   Final diagnoses:  Acute nonintractable headache, unspecified headache type  Essential hypertension    Patient with headache. Has come down somewhat with treatment. Has recently started clonidine also. Head CT reassuring. Will discharge home. No EKG changes. Good renal function.    Benjiman CoreNathan Cassity Christian, MD 06/14/14 [phone]

## 2014-08-01 ENCOUNTER — Telehealth: Payer: Self-pay | Admitting: Internal Medicine

## 2014-08-01 NOTE — Telephone Encounter (Signed)
Patient sent email wanting to establish with Dr. Beverely Lowabori. Spoke to patient and she states that she has already found another pcp.

## 2015-02-09 ENCOUNTER — Other Ambulatory Visit: Payer: Self-pay | Admitting: Family Medicine

## 2015-02-09 DIAGNOSIS — N6310 Unspecified lump in the right breast, unspecified quadrant: Secondary | ICD-10-CM

## 2015-02-15 ENCOUNTER — Ambulatory Visit
Admission: RE | Admit: 2015-02-15 | Discharge: 2015-02-15 | Disposition: A | Discharge status: Home / Self Care | Payer: Medicaid Other | Source: Ambulatory Visit | Attending: Family Medicine | Admitting: Family Medicine

## 2015-02-15 DIAGNOSIS — N6310 Unspecified lump in the right breast, unspecified quadrant: Secondary | ICD-10-CM

## 2015-06-07 ENCOUNTER — Encounter (HOSPITAL_COMMUNITY): Payer: Self-pay | Admitting: *Deleted

## 2015-06-07 ENCOUNTER — Inpatient Hospital Stay (HOSPITAL_COMMUNITY)
Admission: AD | Admit: 2015-06-07 | Discharge: 2015-06-08 | Disposition: A | Discharge status: Home / Self Care | Payer: Medicaid Other | Source: Ambulatory Visit | Attending: Obstetrics & Gynecology | Admitting: Obstetrics & Gynecology

## 2015-06-07 DIAGNOSIS — I10 Essential (primary) hypertension: Secondary | ICD-10-CM | POA: Insufficient documentation

## 2015-06-07 DIAGNOSIS — Z7982 Long term (current) use of aspirin: Secondary | ICD-10-CM | POA: Insufficient documentation

## 2015-06-07 DIAGNOSIS — N390 Urinary tract infection, site not specified: Secondary | ICD-10-CM | POA: Insufficient documentation

## 2015-06-07 DIAGNOSIS — N3090 Cystitis, unspecified without hematuria: Secondary | ICD-10-CM | POA: Diagnosis present

## 2015-06-07 DIAGNOSIS — Z79899 Other long term (current) drug therapy: Secondary | ICD-10-CM | POA: Insufficient documentation

## 2015-06-07 DIAGNOSIS — R300 Dysuria: Secondary | ICD-10-CM | POA: Insufficient documentation

## 2015-06-07 LAB — URINE MICROSCOPIC-ADD ON

## 2015-06-07 LAB — URINALYSIS, ROUTINE W REFLEX MICROSCOPIC
Bilirubin Urine: NEGATIVE
Glucose, UA: NEGATIVE mg/dL
Ketones, ur: NEGATIVE mg/dL
Nitrite: POSITIVE — AB
Protein, ur: 30 mg/dL — AB
Specific Gravity, Urine: 1.025 (ref 1.005–1.030)
pH: 5.5 (ref 5.0–8.0)

## 2015-06-07 LAB — POCT PREGNANCY, URINE: PREG TEST UR: NEGATIVE

## 2015-06-07 MED ORDER — NITROFURANTOIN MONOHYD MACRO 100 MG PO CAPS
100.0000 mg | ORAL_CAPSULE | Freq: Two times a day (BID) | ORAL | Status: AC
Start: 2015-06-07 — End: ?

## 2015-06-07 NOTE — Discharge Instructions (Signed)

## 2015-06-07 NOTE — MAU Note (Signed)
PT SAYS    HAS  PAIN AT  END  OF    VOIDING-   STARTED  1  WEEK   AGO.-   SHE  CALLED  OFFIC E -  HAS  AN APPOINTMENT    4-12-  BUT  PT  IS LEAVING   SAT.      HAS HX  OF  UTI.     BIRTH CONTROL-    PILLS-     LAST SEX-   3-14

## 2015-06-07 NOTE — MAU Provider Note (Signed)
History     CSN: [id number]  Arrival date and time: 06/07/15 2227   First Provider Initiated Contact with Patient 06/07/15 2349      Chief Complaint  Patient presents with  . Cystitis   Dysuria  This is a new problem. The current episode started in the past 7 days. The problem occurs every urination. The problem has been unchanged. The quality of the pain is described as burning. The pain is at a severity of 6/10. There has been no fever. Associated symptoms include frequency and urgency. Pertinent negatives include no chills, nausea or vomiting. She has tried nothing for the symptoms.     Past Medical History  Diagnosis Date  . Hypertension   . Anemia     Past Surgical History  Procedure Laterality Date  . No past surgeries    . Cesarean section N/A 09/24/2013    Procedure: CESAREAN SECTION;  Surgeon: Sharon SellerJennifer M Ozan, DO;  Location: [facility];  Service: Obstetrics;  Laterality: N/A;    Family History  Problem Relation Age of Onset  . Hypertension Mother   . Hyperlipidemia Mother   . Hypertension Father   . Hypertension Maternal Grandmother   . Depression Maternal Grandmother   . Hypertension Maternal Grandfather   . Depression Maternal Grandfather   . Arthritis Paternal Grandmother   . Hypertension Paternal Grandmother   . Depression Paternal Grandmother   . Arthritis Paternal Grandfather   . Hypertension Paternal Grandfather   . Depression Paternal Grandfather     Social History  Substance Use Topics  . Smoking status: Never Smoker   . Smokeless tobacco: None  . Alcohol Use: No    Allergies:  Allergies  Allergen Reactions  . Minocycline Hives and Other (See Comments)    Throat swelling  . Sulfa Antibiotics Hives and Other (See Comments)    Eyes turn red, and can't see     Prescriptions prior to admission  Medication Sig Dispense Refill Last Dose  . aspirin-acetaminophen-caffeine (EXCEDRIN MIGRAINE) 250-250-65 MG per tablet Take 1 tablet by mouth every  6 (six) hours as needed for headache.   06/13/2014 at 1700  . CLARAVIS 40 MG capsule Take 40 mg by mouth 3 (three) times daily.   0 06/12/2014 at 2300  . cloNIDine (CATAPRES) 0.1 MG tablet Take 0.1 mg by mouth 2 (two) times daily.   06/13/2014 at 1300  . ferrous sulfate 325 (65 FE) MG tablet Take 1 tablet (325 mg total) by mouth daily with breakfast. (Patient not taking: Reported on 01/26/2014) 30 tablet 3 Not Taking at Unknown time  . ibuprofen (ADVIL,MOTRIN) 600 MG tablet Take 1 tablet (600 mg total) by mouth every 6 (six) hours. (Patient not taking: Reported on 01/26/2014) 30 tablet 0 Not Taking at Unknown time  . MICROGESTIN FE 1/20 1-20 MG-MCG tablet Take 1 tablet by mouth daily.   6 06/13/2014 at Unknown time  . NIFEdipine (PROCARDIA XL/ADALAT-CC) 90 MG 24 hr tablet Take 1 tablet (90 mg total) by mouth daily. (Patient not taking: Reported on 01/26/2014) 60 tablet 3 Not Taking at Unknown time  . oxyCODONE-acetaminophen (PERCOCET/ROXICET) 5-325 MG per tablet Take 1-2 tablets by mouth every 4 (four) hours as needed for severe pain (moderate - severe pain). (Patient not taking: Reported on 01/26/2014) 30 tablet 0 Not Taking at Unknown time  . propranolol (INDERAL) 40 MG tablet Take 40 mg by mouth 2 (two) times daily.   06/13/2014 at 1000    Review of Systems  Constitutional: Negative  for fever and chills.  Gastrointestinal: Positive for abdominal pain. Negative for nausea, vomiting, diarrhea and constipation.  Genitourinary: Positive for dysuria, urgency and frequency.   Physical Exam   Blood pressure 139/88, pulse 77, temperature 98.6 F (37 C), temperature source Oral, resp. rate 20, height  (1.575 m), weight 99.338 kg (219 lb), last menstrual period 05/18/2015, unknown if currently breastfeeding.  Physical Exam  Nursing note and vitals reviewed. Constitutional: She is oriented to person, place, and time. She appears well-developed and well-nourished. No distress.  HENT:  Head:  Normocephalic.  Respiratory: Effort normal.  GI: Soft. There is no tenderness. There is no rebound.  Musculoskeletal: Normal range of motion.  Neurological: She is alert and oriented to person, place, and time.  Skin: Skin is warm and dry.  Psychiatric: She has a normal mood and affect.   Results for orders placed or performed during the hospital encounter of 06/07/15 (from the past 24 hour(s))  Urinalysis, Routine w reflex microscopic (not at [facility])     Status: Abnormal   Collection Time: 06/07/15 10:53 PM  Result Value Ref Range   Color, Urine YELLOW YELLOW   APPearance CLOUDY (A) CLEAR   Specific Gravity, Urine 1.025 1.005 - 1.030   pH 5.5 5.0 - 8.0   Glucose, UA NEGATIVE NEGATIVE mg/dL   Hgb urine dipstick SMALL (A) NEGATIVE   Bilirubin Urine NEGATIVE NEGATIVE   Ketones, ur NEGATIVE NEGATIVE mg/dL   Protein, ur 30 (A) NEGATIVE mg/dL   Nitrite POSITIVE (A) NEGATIVE   Leukocytes, UA SMALL (A) NEGATIVE  Urine microscopic-add on     Status: Abnormal   Collection Time: 06/07/15 10:53 PM  Result Value Ref Range   Squamous Epithelial / LPF 0-5 (A) NONE SEEN   WBC, UA TOO NUMEROUS TO COUNT 0 - 5 WBC/hpf   RBC / HPF 6-30 0 - 5 RBC/hpf   Bacteria, UA FEW (A) NONE SEEN  Pregnancy, urine POC     Status: None   Collection Time: 06/07/15 11:03 PM  Result Value Ref Range   Preg Test, Ur NEGATIVE NEGATIVE    MAU Course  Procedures  MDM   Assessment and Plan   1. UTI (lower urinary tract infection)    DC home Comfort measures reviewed  RX: macrobid BID x 5 days  Return to MAU as needed FU with OB as planned  Follow-up Information    Follow up with [facility].   Why:  If symptoms worsen   Contact information:   [address] [place] [address] [phone]       Tawnya Crook 06/07/2015, 11:49 PM

## 2015-08-31 IMAGING — US US OB COMP +14 WK
1 series · 12 of 28 positions shown · non-contrast
Comparison: none

[Series 1: us ob comp +14 wk · 0.24mm/px · 12 of 59 slices shown]
[im 3/59]
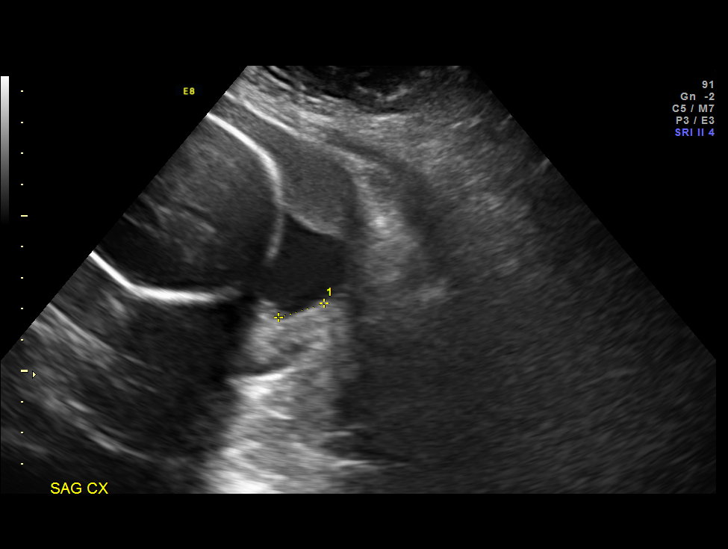
[im 7/59]
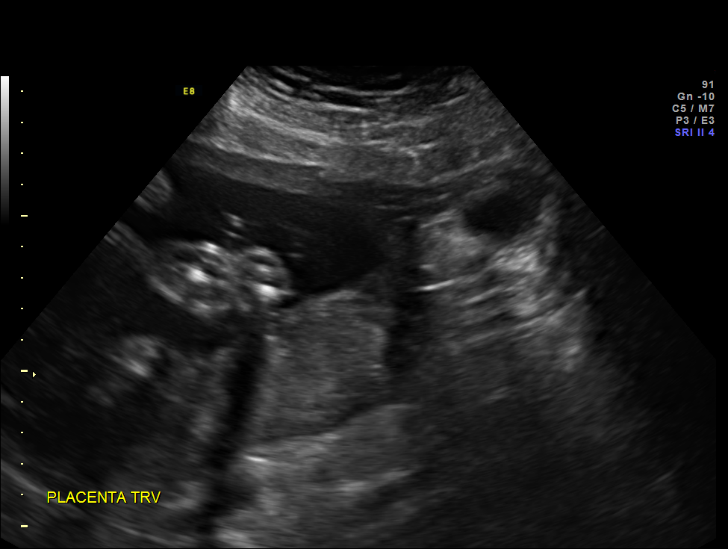
[im 11/59]
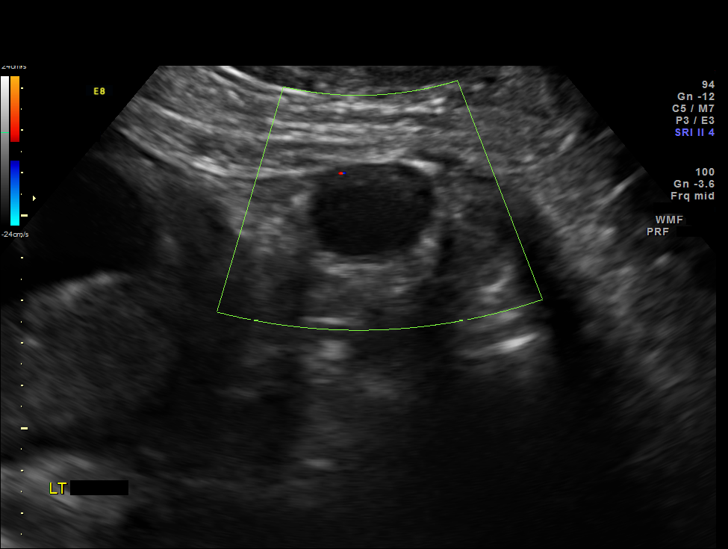
[im 18/59]
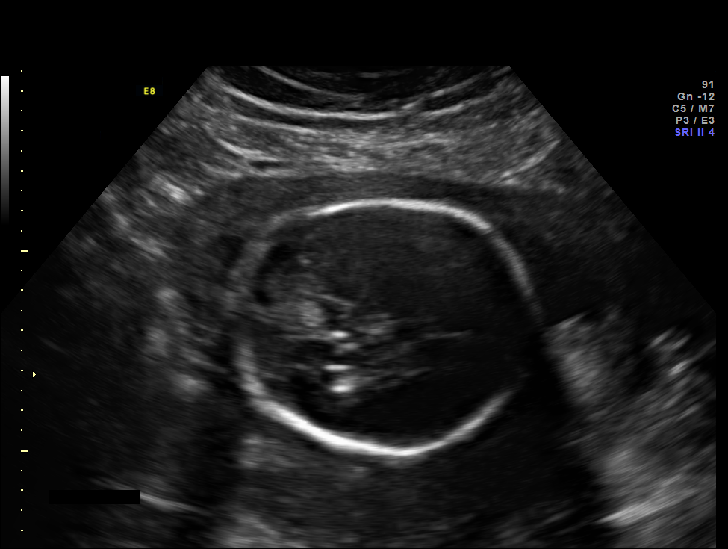
[im 22/59]
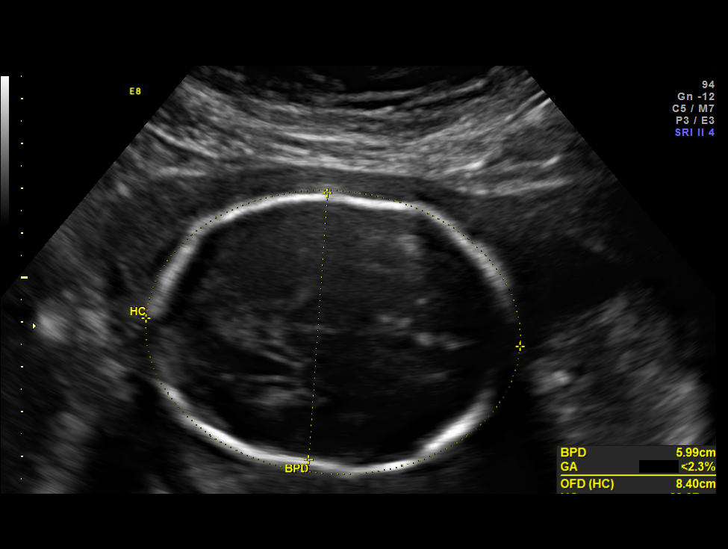
[im 26/59]
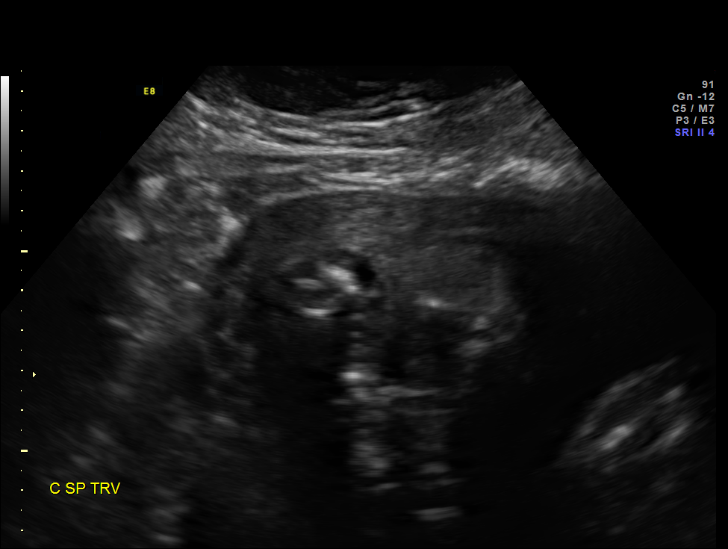
[im 33/59]
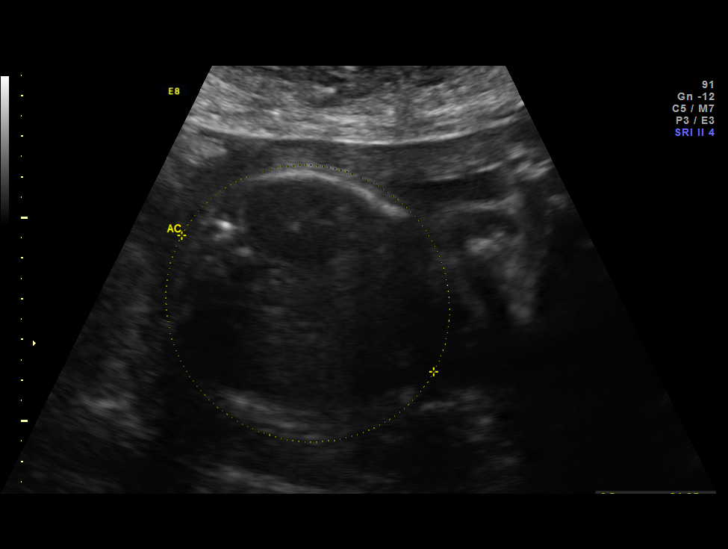
[im 37/59]
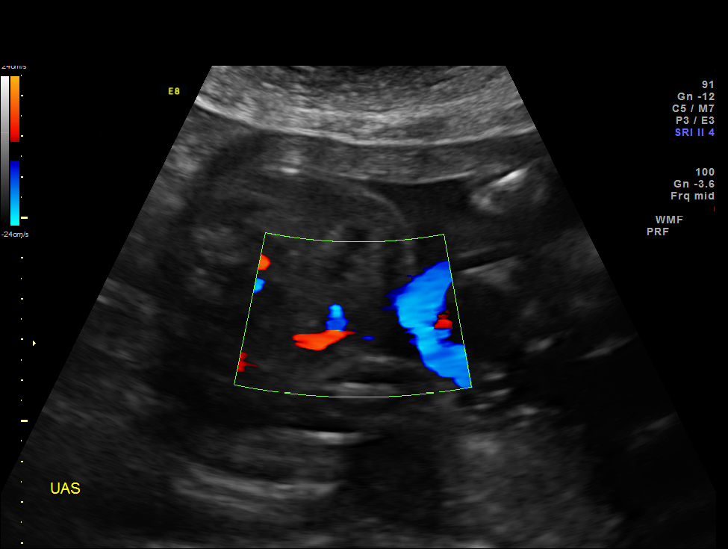
[im 41/59]
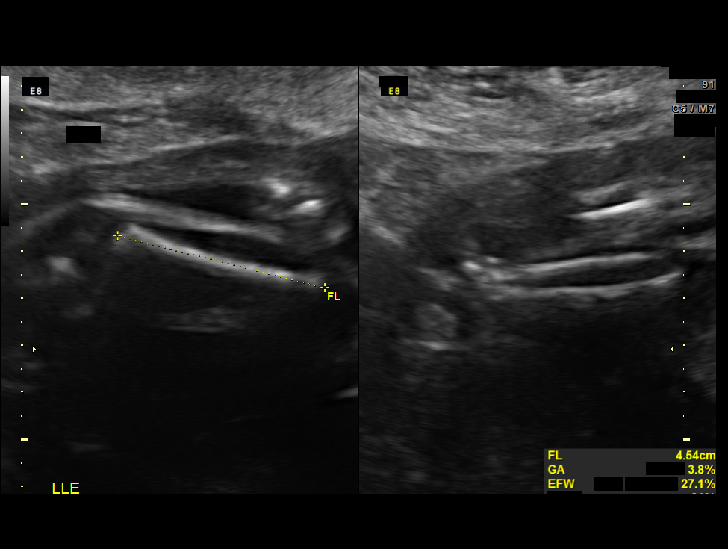
[im 48/59]
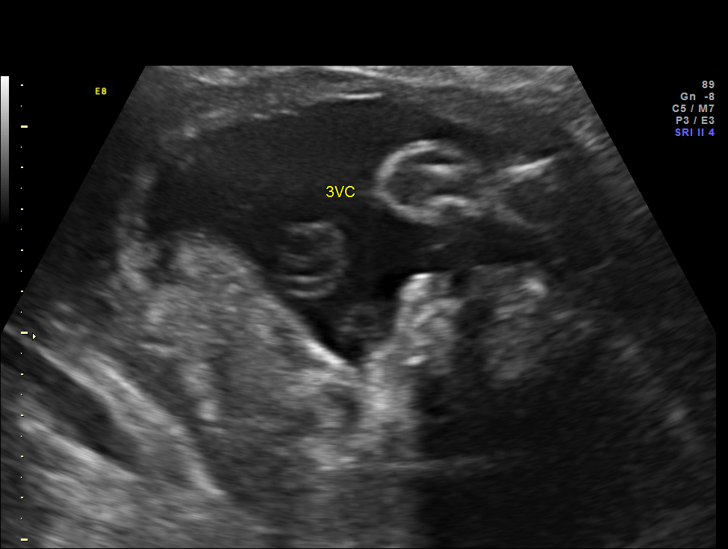
[im 52/59]
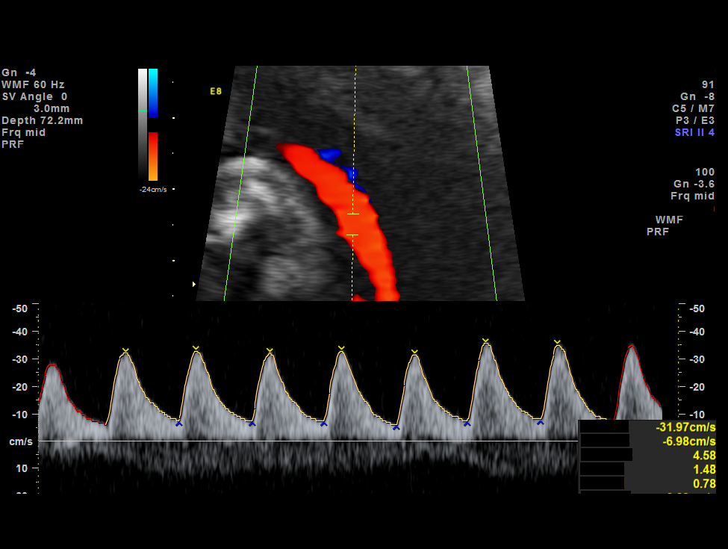
[im 56/59]
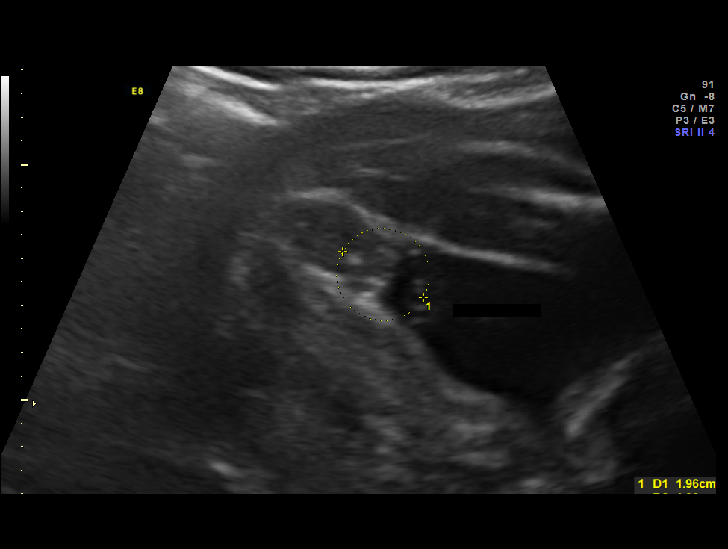

[12 of 28 positions shown; findings below may reference images not displayed]

OBSTETRICS REPORT
                      (Signed Final 09/22/2013 [DATE])

Service(s) Provided

 US OB COMP + 14 WK                                    76805.1
Indications

 Hypertension - Chronic/Pre-existing
 Late Prenatal Care
 Poor obstetric history: Previous macrosomia
 Placenta previa/Low lying: No bleeding
 Maternal morbid obesity
Fetal Evaluation

 Num Of Fetuses:    1
 Fetal Heart Rate:  142                          bpm
 Cardiac Activity:  Observed
 Presentation:      Cephalic
 Placenta:          Posterior, low-lying,
                    cm from int os
 P. Cord            Visualized, central
 Insertion:

 Amniotic Fluid
 AFI FV:      Subjectively within normal limits
                                             Larg Pckt:     3.4  cm
Biometry

 BPD:     59.8  mm     G. Age:  24w 3d                CI:         72.3   70 - 86
 OFD:     82.7  mm                                    FL/HC:      20.2   18.6 -

 HC:     229.7  mm     G. Age:  25w 0d      < 3  %    HC/AC:      1.08   1.05 -

 AC:     212.9  mm     G. Age:  25w 6d       18  %    FL/BPD:     77.4   71 - 87
 FL:      46.3  mm     G. Age:  25w 3d        8  %    FL/AC:      21.7   20 - 24
 HUM:     43.2  mm     G. Age:  25w 5d       25  %
 CER:     28.7  mm     G. Age:  25w 5d       29  %

 Est. FW:     817  gm    1 lb 13 oz      27  %
Gestational Age

 LMP:           26w 5d        Date:  03/19/13                 EDD:   12/24/13
 U/S Today:     25w 1d                                        EDD:   01/04/14
 Best:          26w 5d     Det. By:  LMP  (03/19/13)          EDD:   12/24/13
Anatomy

 Cranium:          Appears normal         Aortic Arch:      Not well visualized
 Fetal Cavum:      Appears normal         Ductal Arch:      Not well visualized
 Ventricles:       Appears normal         Diaphragm:        Appears normal
 Choroid Plexus:   Appears normal         Stomach:          Appears normal, left
                                                            sided
 Cerebellum:       Appears normal         Abdomen:          Appears normal
 Posterior Fossa:  Appears normal         Abdominal Wall:   Appears nml (cord
                                                            insert, abd wall)
 Nuchal Fold:      Not applicable (>20    Cord Vessels:     Appears normal (3
                   wks GA)                                  vessel cord)
 Face:             Not well visualized    Kidneys:          Appear normal
 Lips:             Not well visualized    Bladder:          Appears normal
 Heart:            Not well visualized    Spine:            Appears normal
 RVOT:             Not well visualized    Lower             Visualized
                                          Extremities:
 LVOT:             Not well visualized    Upper             Visualized
                                          Extremities:

 Other:  Fetus appears to be a female. Technically difficult due to maternal
         habitus and fetal position.
Targeted Anatomy

 Fetal Central Nervous System
 Cisterna Magna:
Cervix Uterus Adnexa

 Cervical Length:    4.3      cm

 Cervix:       Normal appearance by transabdominal scan.
 Left Ovary:    Within normal limits. Small corpus luteum noted.
 Right Ovary:   Not visualized.

 Adnexa:     No abnormality visualized.
Impression

 Single IUP at 26w 5d
 Preeclampsia with severe features
 The estimated fetal weight today is at the 27th %tile; the AC
 measures at the 18th tile
 Limited views of the fetal heart obtained due to fetal position
 and maternal body habitus
 UA Doppler studies elevated for gestational [AGE]th %tile)
 but without AEDF or REDF
 Normal amniotic fluid volume
 A posterior, low lying placenta is noted
Recommendations

 See separate consult note

## 2016-05-21 IMAGING — CT CT HEAD W/O CM
2 series · 16 of 30 positions shown, 20 images · non-contrast
Comparison: None.

CLINICAL DATA: Headache and hypertension.

EXAM:
CT HEAD WITHOUT CONTRAST
TECHNIQUE: Contiguous axial images were obtained from the base of the skull
through the vertex without intravenous contrast.

[Series 2: head w/o · axial · non-contrast · 0.45mm/px · z∈[+1525,+1645]mm · 13 of 28 slices shown, 17 images]
[im 2/28  brain]
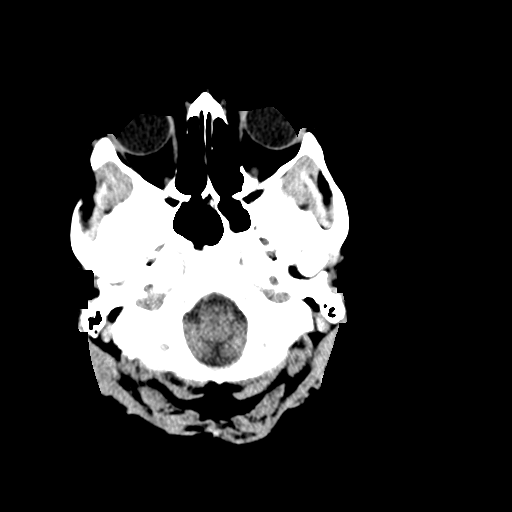
[im 2/28  bone]
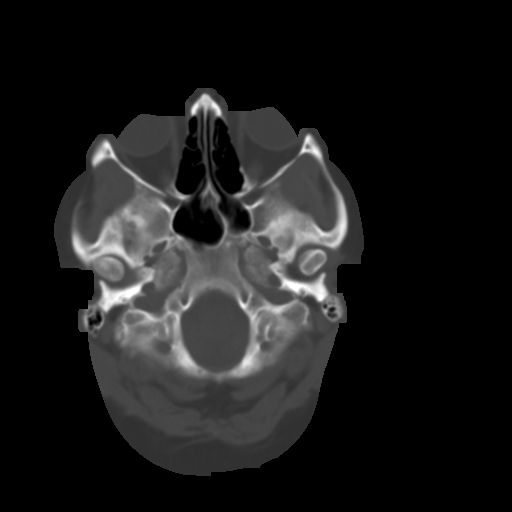
[im 4/28  brain]
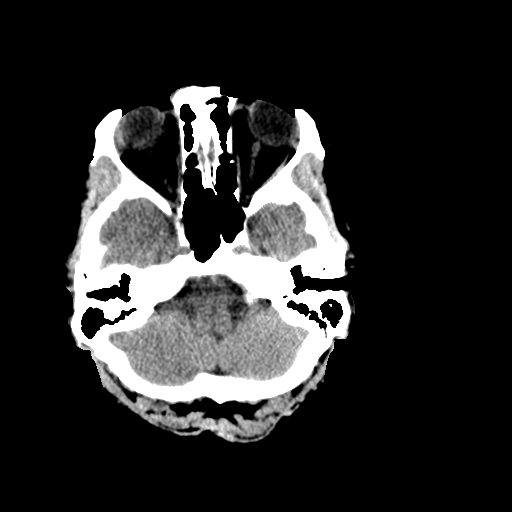
[im 6/28  brain]
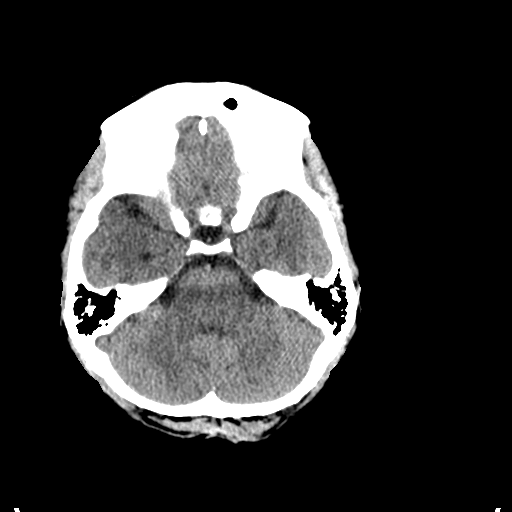
[im 8/28  brain]
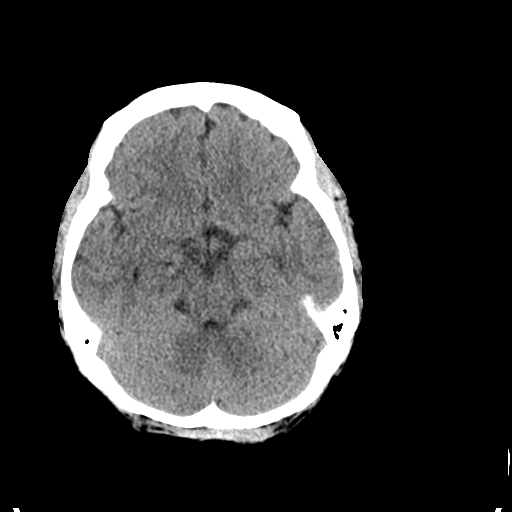
[im 10/28  brain]
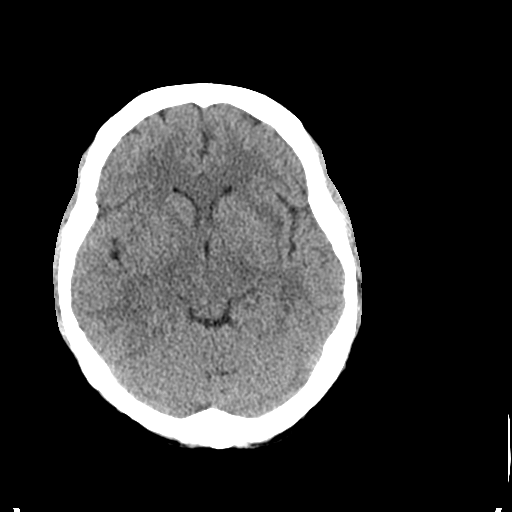
[im 10/28  bone]
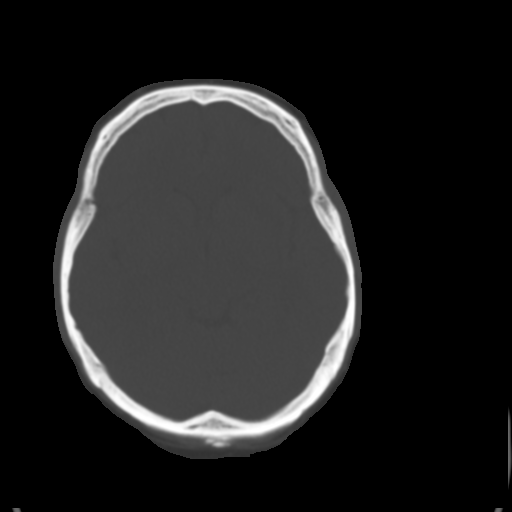
[im 12/28  brain]
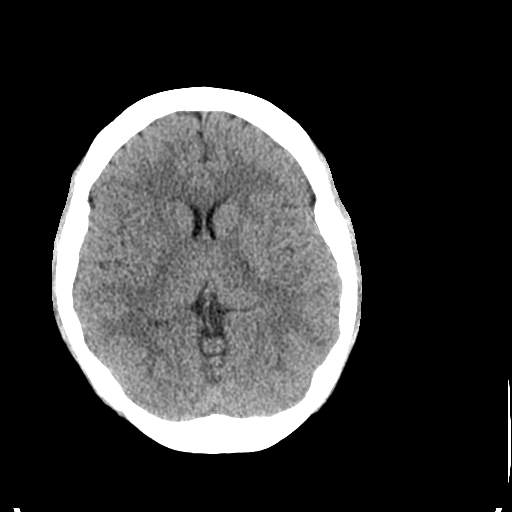
[im 14/28  brain]
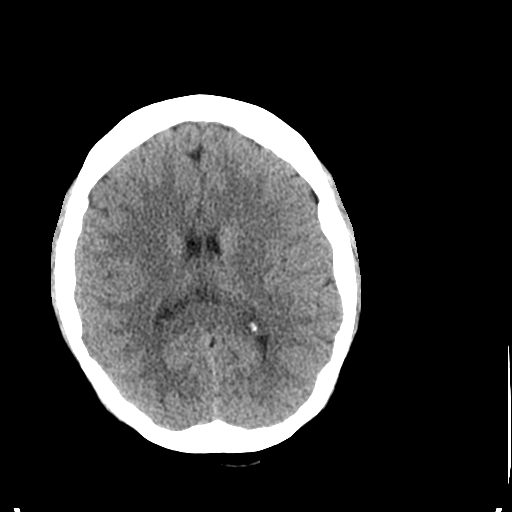
[im 16/28  brain]
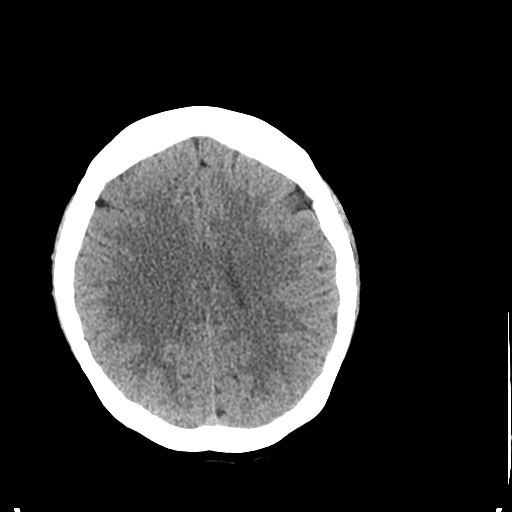
[im 18/28  brain]
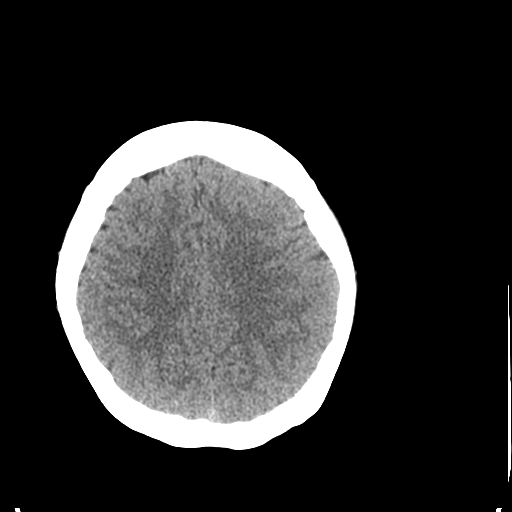
[im 18/28  bone]
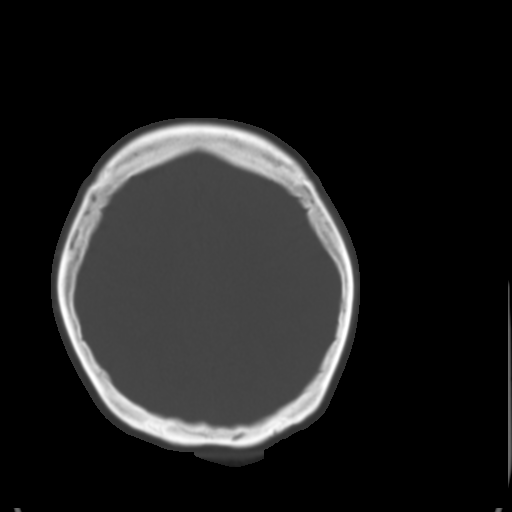
[im 20/28  brain]
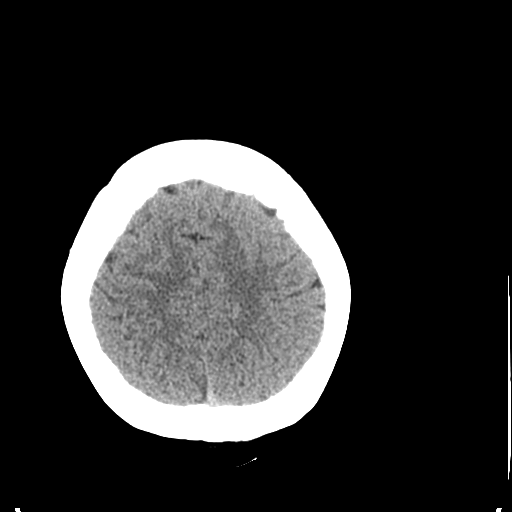
[im 22/28  brain]
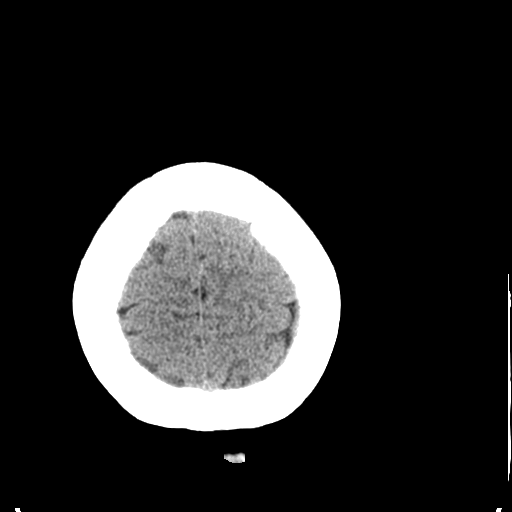
[im 24/28  brain]
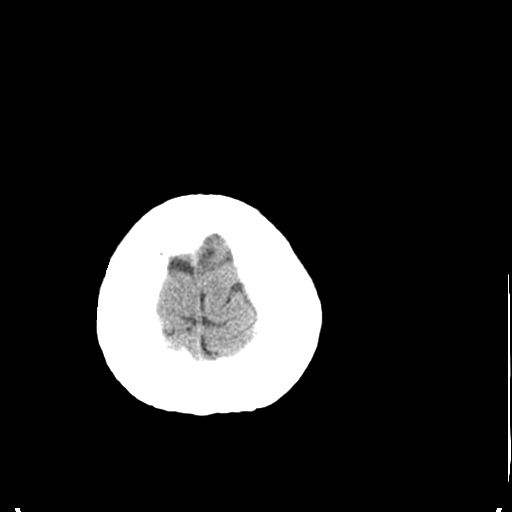
[im 26/28  brain]
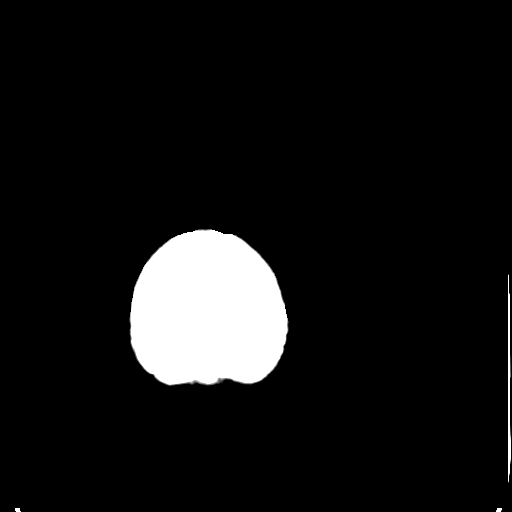
[im 26/28  bone]
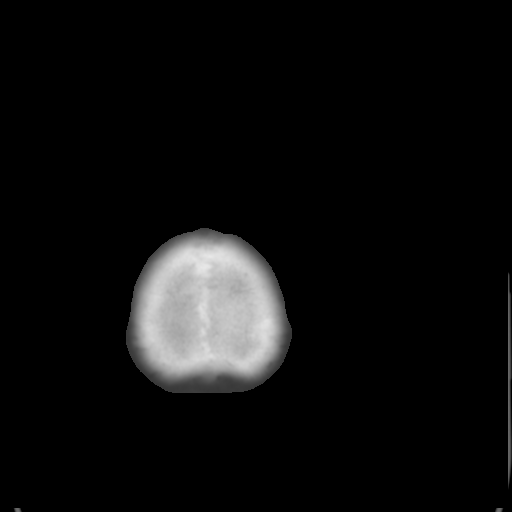

[Series 3: bone windows · axial · 0.45mm/px · z∈[+1525,+1565]mm · 3 of 28 slices shown]
[im 2/28  bone]
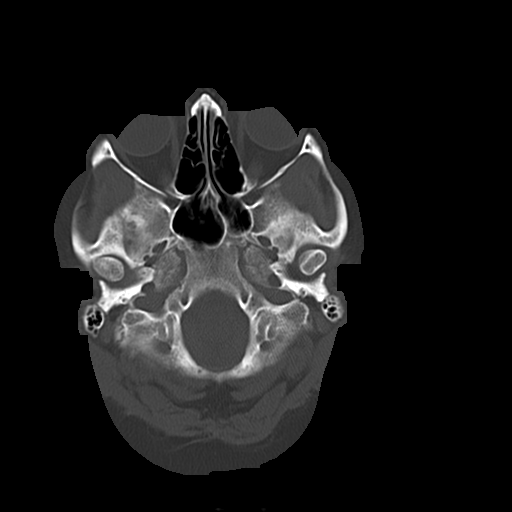
[im 6/28  bone]
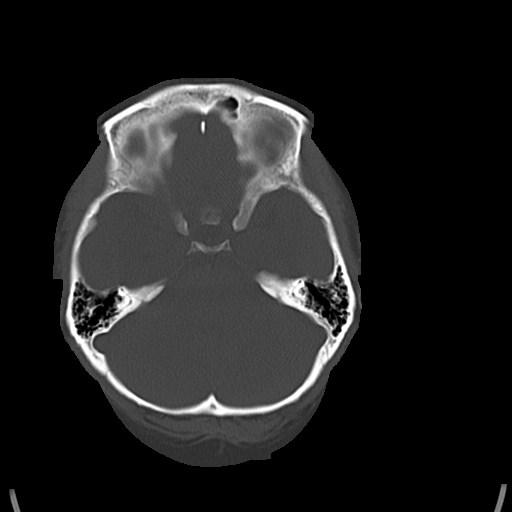
[im 10/28  bone]
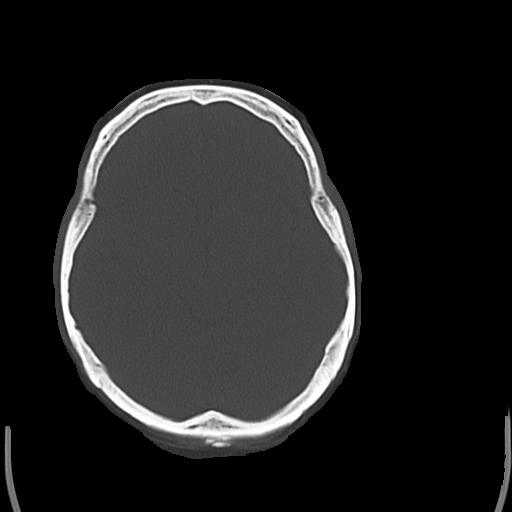

[16 of 30 positions shown; findings below may reference images not displayed]

FINDINGS: Sinuses/Soft tissues: Clear paranasal sinuses and mastoid air cells.

Intracranial: No mass lesion, hemorrhage, hydrocephalus, acute
infarct, intra-axial, or extra-axial fluid collection.
IMPRESSION: Normal head CT.
